# Patient Record
Sex: Female | Born: 1975 | Race: White | Hispanic: No | Marital: Married | Smoking: Never smoker
Health system: Southern US, Community
[De-identification: ages and names within clinical notes are randomized; demographics above are authoritative.]

## PROBLEM LIST (undated history)

## (undated) DIAGNOSIS — D649 Anemia, unspecified: Secondary | ICD-10-CM

## (undated) DIAGNOSIS — E119 Type 2 diabetes mellitus without complications: Secondary | ICD-10-CM

## (undated) DIAGNOSIS — I10 Essential (primary) hypertension: Secondary | ICD-10-CM

## (undated) DIAGNOSIS — J45909 Unspecified asthma, uncomplicated: Secondary | ICD-10-CM

## (undated) DIAGNOSIS — I82409 Acute embolism and thrombosis of unspecified deep veins of unspecified lower extremity: Secondary | ICD-10-CM

## (undated) DIAGNOSIS — R011 Cardiac murmur, unspecified: Secondary | ICD-10-CM

## (undated) DIAGNOSIS — K219 Gastro-esophageal reflux disease without esophagitis: Secondary | ICD-10-CM

## (undated) DIAGNOSIS — E039 Hypothyroidism, unspecified: Secondary | ICD-10-CM

## (undated) HISTORY — PX: TONSILLECTOMY AND ADENOIDECTOMY: SUR1326

## (undated) HISTORY — DX: Type 2 diabetes mellitus without complications: E11.9

## (undated) HISTORY — DX: Hypothyroidism, unspecified: E03.9

## (undated) HISTORY — DX: Essential (primary) hypertension: I10

## (undated) HISTORY — DX: Unspecified asthma, uncomplicated: J45.909

## (undated) HISTORY — PX: CARPAL TUNNEL RELEASE: SHX101

---

## 2002-02-05 ENCOUNTER — Other Ambulatory Visit: Admission: RE | Admit: 2002-02-05 | Discharge: 2002-02-05 | Payer: Self-pay | Admitting: Obstetrics and Gynecology

## 2002-09-15 ENCOUNTER — Encounter: Payer: Self-pay | Admitting: Obstetrics and Gynecology

## 2002-09-15 ENCOUNTER — Ambulatory Visit (HOSPITAL_COMMUNITY): Admission: RE | Admit: 2002-09-15 | Discharge: 2002-09-15 | Payer: Self-pay | Admitting: Obstetrics and Gynecology

## 2002-09-30 ENCOUNTER — Encounter: Admission: RE | Admit: 2002-09-30 | Discharge: 2002-12-29 | Payer: Self-pay | Admitting: Obstetrics and Gynecology

## 2002-11-13 ENCOUNTER — Encounter: Payer: Self-pay | Admitting: Obstetrics and Gynecology

## 2002-11-13 ENCOUNTER — Ambulatory Visit (HOSPITAL_COMMUNITY): Admission: RE | Admit: 2002-11-13 | Discharge: 2002-11-13 | Payer: Self-pay | Admitting: Obstetrics and Gynecology

## 2002-12-21 ENCOUNTER — Inpatient Hospital Stay (HOSPITAL_COMMUNITY): Admission: AD | Admit: 2002-12-21 | Discharge: 2002-12-21 | Payer: Self-pay | Admitting: Obstetrics and Gynecology

## 2002-12-23 ENCOUNTER — Inpatient Hospital Stay (HOSPITAL_COMMUNITY): Admission: AD | Admit: 2002-12-23 | Discharge: 2002-12-23 | Payer: Self-pay | Admitting: Obstetrics and Gynecology

## 2002-12-24 ENCOUNTER — Inpatient Hospital Stay (HOSPITAL_COMMUNITY): Admission: AD | Admit: 2002-12-24 | Discharge: 2002-12-24 | Payer: Self-pay | Admitting: Obstetrics and Gynecology

## 2003-01-01 ENCOUNTER — Inpatient Hospital Stay (HOSPITAL_COMMUNITY): Admission: AD | Admit: 2003-01-01 | Discharge: 2003-01-01 | Payer: Self-pay | Admitting: Obstetrics and Gynecology

## 2003-01-02 ENCOUNTER — Inpatient Hospital Stay (HOSPITAL_COMMUNITY): Admission: AD | Admit: 2003-01-02 | Discharge: 2003-01-02 | Payer: Self-pay | Admitting: Obstetrics and Gynecology

## 2003-01-03 ENCOUNTER — Encounter: Payer: Self-pay | Admitting: Obstetrics and Gynecology

## 2003-01-03 ENCOUNTER — Inpatient Hospital Stay (HOSPITAL_COMMUNITY): Admission: AD | Admit: 2003-01-03 | Discharge: 2003-01-03 | Payer: Self-pay | Admitting: Obstetrics and Gynecology

## 2003-01-05 ENCOUNTER — Inpatient Hospital Stay (HOSPITAL_COMMUNITY): Admission: AD | Admit: 2003-01-05 | Discharge: 2003-01-17 | Payer: Self-pay | Admitting: Obstetrics and Gynecology

## 2003-01-05 ENCOUNTER — Encounter: Payer: Self-pay | Admitting: Obstetrics and Gynecology

## 2003-01-07 ENCOUNTER — Encounter: Payer: Self-pay | Admitting: Obstetrics and Gynecology

## 2003-01-11 ENCOUNTER — Encounter: Payer: Self-pay | Admitting: Obstetrics and Gynecology

## 2003-01-13 ENCOUNTER — Encounter (INDEPENDENT_AMBULATORY_CARE_PROVIDER_SITE_OTHER): Payer: Self-pay | Admitting: *Deleted

## 2003-02-26 ENCOUNTER — Other Ambulatory Visit: Admission: RE | Admit: 2003-02-26 | Discharge: 2003-02-26 | Payer: Self-pay | Admitting: Obstetrics and Gynecology

## 2004-08-08 ENCOUNTER — Other Ambulatory Visit: Admission: RE | Admit: 2004-08-08 | Discharge: 2004-08-08 | Payer: Self-pay | Admitting: Obstetrics and Gynecology

## 2004-11-09 ENCOUNTER — Ambulatory Visit (HOSPITAL_COMMUNITY): Admission: RE | Admit: 2004-11-09 | Discharge: 2004-11-09 | Payer: Self-pay | Admitting: Obstetrics and Gynecology

## 2005-01-05 ENCOUNTER — Ambulatory Visit (HOSPITAL_COMMUNITY): Admission: RE | Admit: 2005-01-05 | Discharge: 2005-01-05 | Payer: Self-pay | Admitting: Obstetrics and Gynecology

## 2005-01-31 ENCOUNTER — Ambulatory Visit (HOSPITAL_COMMUNITY): Admission: RE | Admit: 2005-01-31 | Discharge: 2005-01-31 | Payer: Self-pay | Admitting: Obstetrics and Gynecology

## 2005-03-27 ENCOUNTER — Ambulatory Visit (HOSPITAL_COMMUNITY): Admission: RE | Admit: 2005-03-27 | Discharge: 2005-03-27 | Payer: Self-pay | Admitting: Obstetrics and Gynecology

## 2005-04-06 ENCOUNTER — Ambulatory Visit: Payer: Self-pay | Admitting: Obstetrics & Gynecology

## 2005-05-01 ENCOUNTER — Inpatient Hospital Stay (HOSPITAL_COMMUNITY): Admission: AD | Admit: 2005-05-01 | Discharge: 2005-05-01 | Payer: Self-pay | Admitting: Obstetrics and Gynecology

## 2005-05-04 ENCOUNTER — Inpatient Hospital Stay (HOSPITAL_COMMUNITY): Admission: AD | Admit: 2005-05-04 | Discharge: 2005-05-06 | Payer: Self-pay | Admitting: Obstetrics and Gynecology

## 2005-05-08 ENCOUNTER — Inpatient Hospital Stay (HOSPITAL_COMMUNITY): Admission: AD | Admit: 2005-05-08 | Discharge: 2005-05-08 | Payer: Self-pay | Admitting: Obstetrics and Gynecology

## 2005-05-13 ENCOUNTER — Inpatient Hospital Stay (HOSPITAL_COMMUNITY): Admission: AD | Admit: 2005-05-13 | Discharge: 2005-05-17 | Payer: Self-pay | Admitting: Obstetrics and Gynecology

## 2005-05-13 ENCOUNTER — Encounter (INDEPENDENT_AMBULATORY_CARE_PROVIDER_SITE_OTHER): Payer: Self-pay | Admitting: *Deleted

## 2005-05-16 ENCOUNTER — Ambulatory Visit: Payer: Self-pay | Admitting: Internal Medicine

## 2005-05-19 ENCOUNTER — Ambulatory Visit: Payer: Self-pay | Admitting: Family Medicine

## 2005-05-29 ENCOUNTER — Ambulatory Visit: Payer: Self-pay | Admitting: Family Medicine

## 2005-08-25 ENCOUNTER — Ambulatory Visit: Payer: Self-pay | Admitting: Family Medicine

## 2005-10-13 ENCOUNTER — Ambulatory Visit: Payer: Self-pay | Admitting: Family Medicine

## 2007-04-23 IMAGING — US US OB FOLLOW-UP
1 series · 13 of 28 positions shown · non-contrast
Comparison: none

CLINICAL DATA: Assess estimated fetal weight and growth.  Hypertension.  Assess fetal well-being.  Insulin dependent diabetes.

[Series 1: us ob follow-up · 13 of 36 slices shown]
[im 2/36]
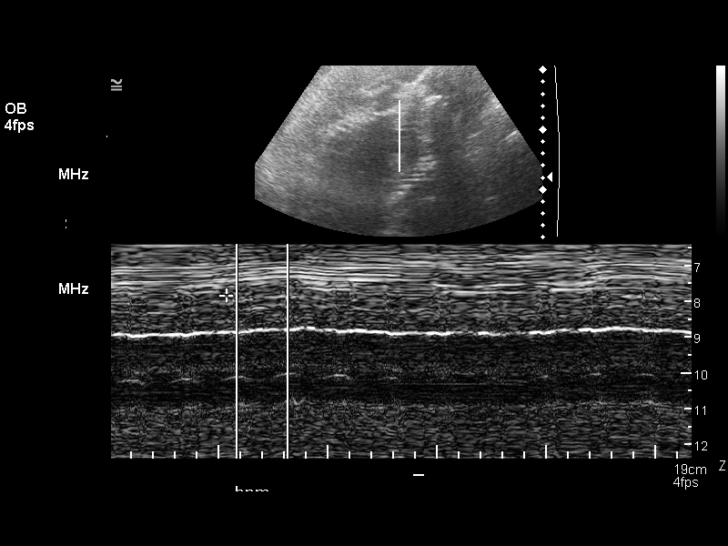
[im 4/36]
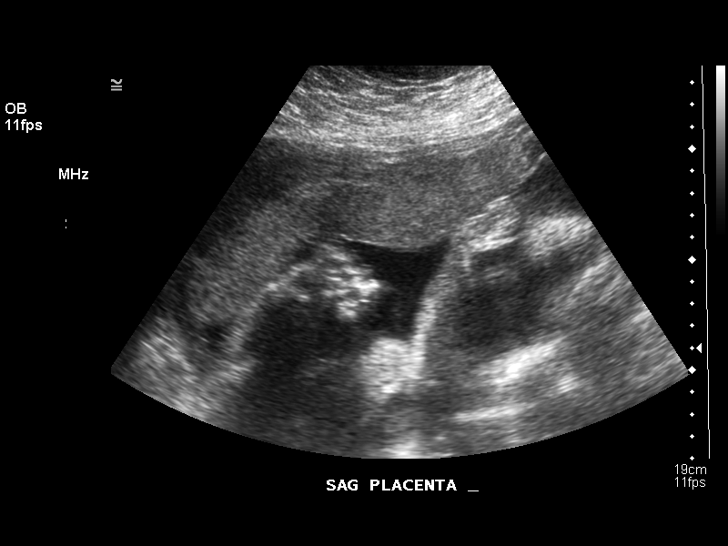
[im 7/36]
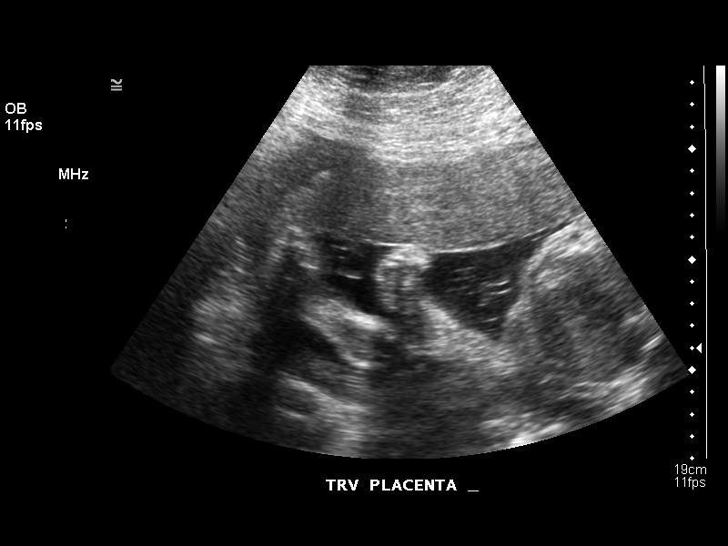
[im 10/36]
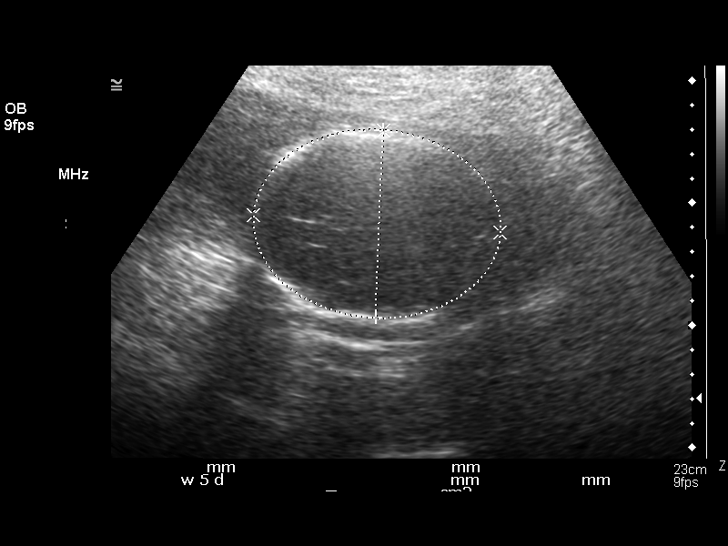
[im 12/36]
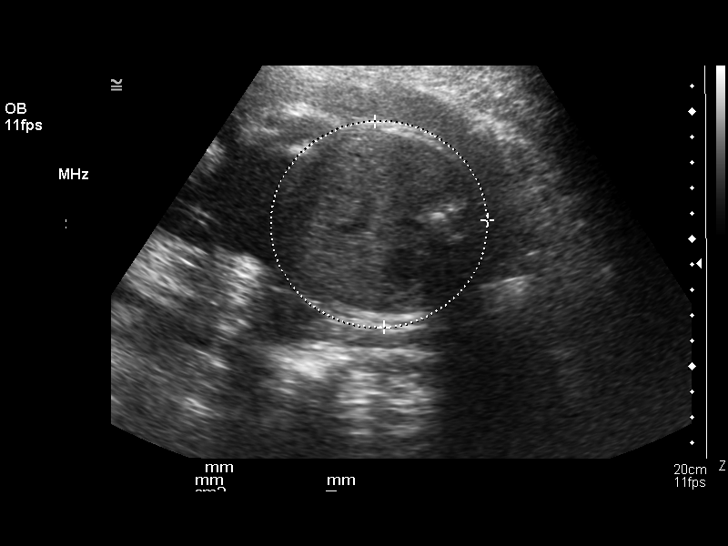
[im 15/36]
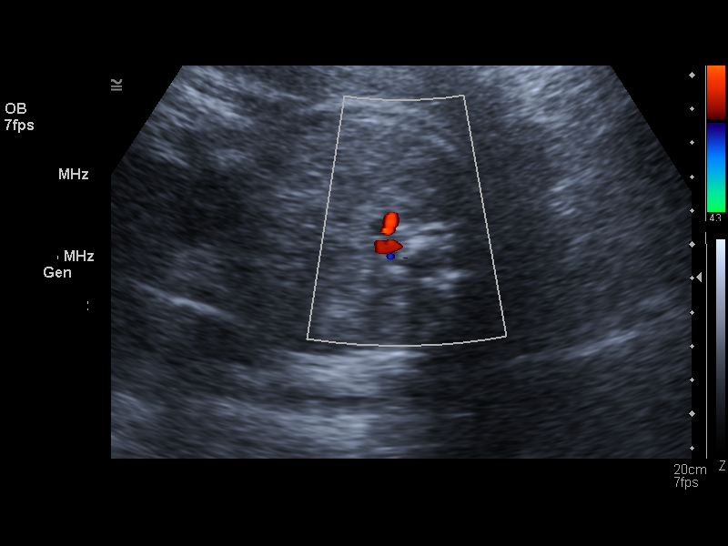
[im 19/36]
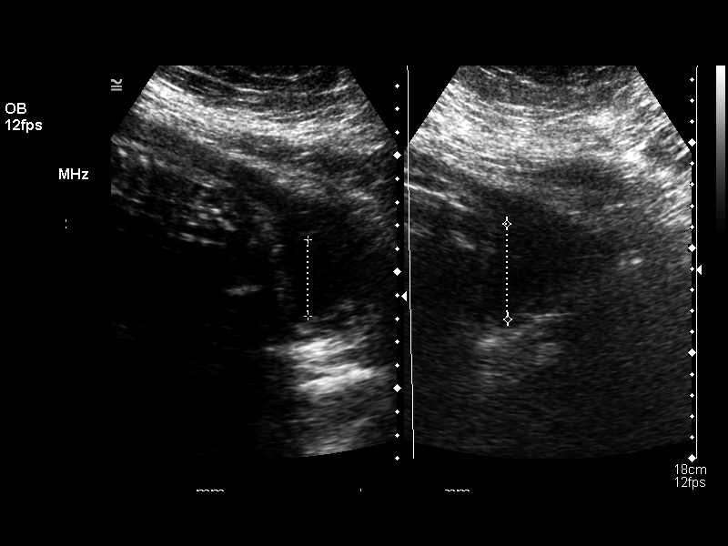
[im 21/36]
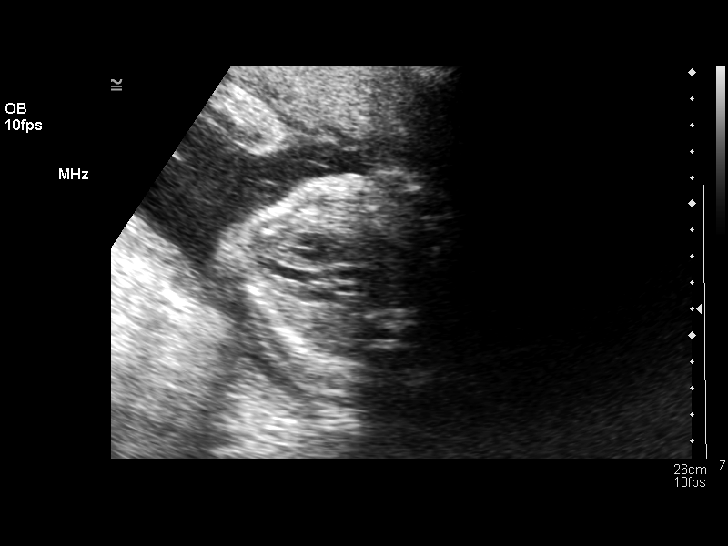
[im 24/36]
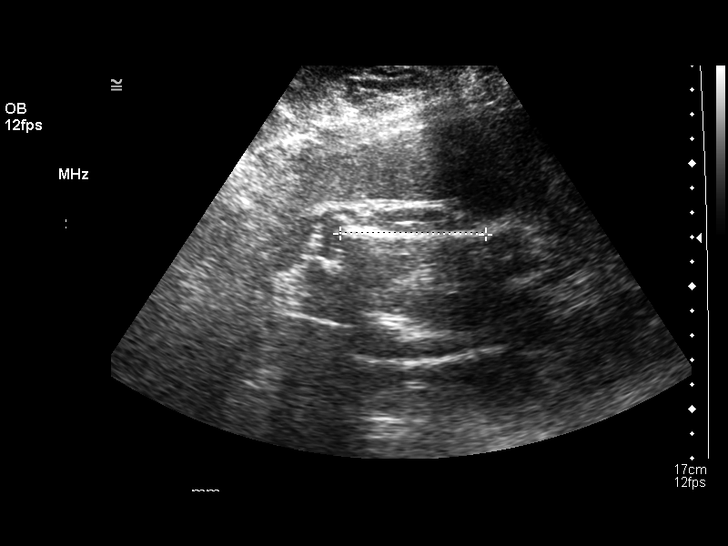
[im 26/36]
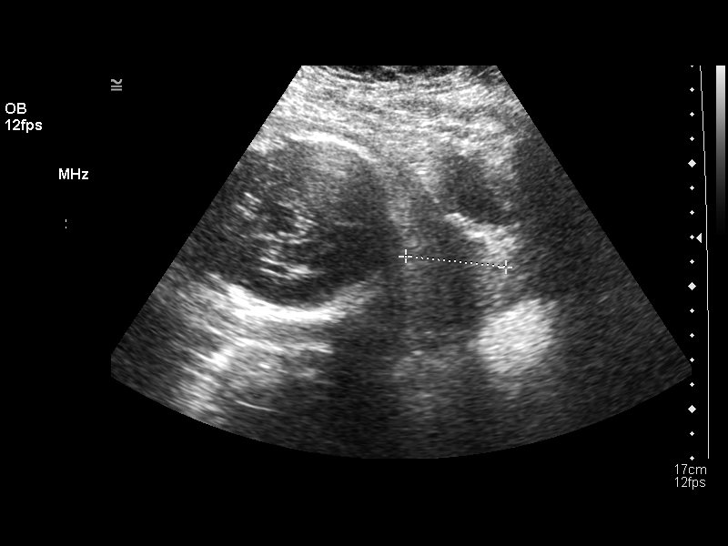
[im 29/36]
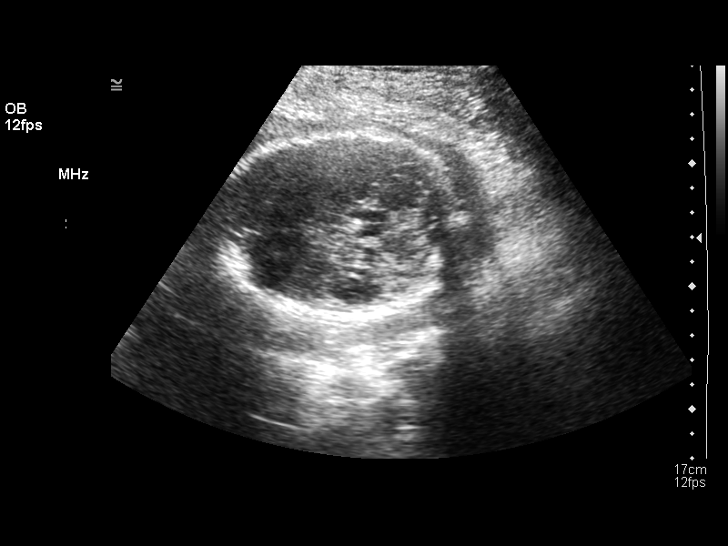
[im 32/36]
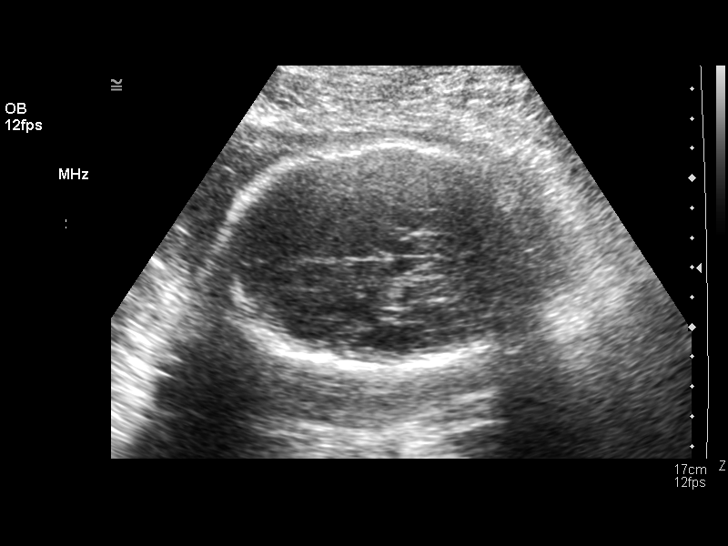
[im 34/36]
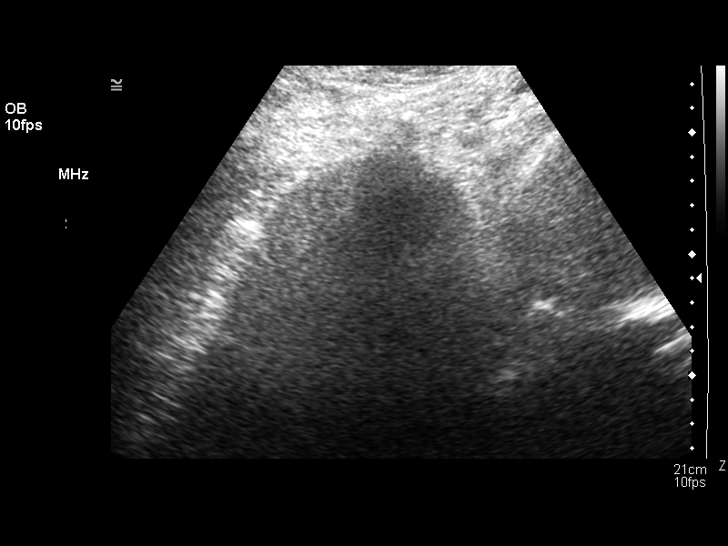

[13 of 28 positions shown; findings below may reference images not displayed]

OBSTETRICAL ULTRASOUND RE-EVALUATION:
Number of Fetuses:  1
Heart Rate  128:
Movement:  Yes
Breathing:  No
Presentation:  Cephalic
Placental Location:  Anterior
Grade:  I
Previa:  No
Amniotic Fluid (subjective):  Normal
Amniotic Fluid (objective):  16.6 cm AFI (5th -95th%ile = 9.0 ? 23.4 cm for 30 wks)

FETAL BIOMETRY
BPD:  7.5 cm   30 w 1 d
HC:  29.0 cm   31 w 3 d
AC:  26.0 cm   30 w 1 d
FL:  5.9 cm   30 w 5 d

Mean GA:  30 w 4 d
Assigned GA:  29 w 6 d
Fetal indices are within normal limits.
EFW:  3215 g (H) 75th ? 90th%ile (7377 ? 6117 g) For 30 wks

FETAL ANATOMY
Lateral Ventricles:  Visualized 
Thalami/CSP:  Visualized 
Posterior Fossa:  Previously seen 
Nuchal Region:  N/A
Spine:  Not visualized 
4 Chamber Heart on Left:  Previously seen 
Stomach on Left:  Visualized 
3 Vessel Cord:  Previously seen 
Cord Insertion Site:  Not visualized 
Kidneys:  Visualized 
Bladder:  Visualized 
Extremities:  Previously seen 

Evaluation limited by:  Maternal habitus

MATERNAL UTERINE AND ADNEXAL FINDINGS
Cervix:  4.1 cm Transabdominally

BIOPHYSICAL PROFILE

Movement:   2    Time:  30 minutes
Breathing:  0
Tone:  2
Amniotic Fluid:  2

Total Score:  6
IMPRESSION: 1.  Single intrauterine pregnancy demonstrating an estimated gestational age by ultrasound of 30 weeks and 4 days.  Correlation with assigned gestational age by LMP of 29 weeks and 6 days suggests appropriate growth.  Currently the estimated fetal weight is just above the 75th percentile for a 30 week gestation.  
2.  Subjectively and quantitatively normal amniotic fluid volume and normal cervical length.  
3.  No late developing fetal anatomic abnormalities are identified associated with the lateral ventricles, stomach, kidneys or bladder.  A four chamber heart view could not be reassessed due to positioning on today?s exam.  
4.  Biophysical profile score [DATE].  The fetus scored zero for fetal breathing.

## 2007-05-09 ENCOUNTER — Ambulatory Visit: Payer: Self-pay | Admitting: Otolaryngology

## 2007-05-13 ENCOUNTER — Ambulatory Visit: Payer: Self-pay | Admitting: Otolaryngology

## 2008-09-21 ENCOUNTER — Inpatient Hospital Stay (HOSPITAL_COMMUNITY): Admission: AD | Admit: 2008-09-21 | Discharge: 2008-09-21 | Payer: Self-pay | Admitting: Obstetrics and Gynecology

## 2008-09-24 ENCOUNTER — Inpatient Hospital Stay (HOSPITAL_COMMUNITY): Admission: AD | Admit: 2008-09-24 | Discharge: 2008-09-24 | Payer: Self-pay | Admitting: Obstetrics and Gynecology

## 2008-09-25 ENCOUNTER — Inpatient Hospital Stay (HOSPITAL_COMMUNITY): Admission: AD | Admit: 2008-09-25 | Discharge: 2008-09-25 | Payer: Self-pay | Admitting: Obstetrics and Gynecology

## 2008-09-28 ENCOUNTER — Inpatient Hospital Stay (HOSPITAL_COMMUNITY): Admission: AD | Admit: 2008-09-28 | Discharge: 2008-09-28 | Payer: Self-pay | Admitting: Obstetrics and Gynecology

## 2008-10-06 ENCOUNTER — Encounter (INDEPENDENT_AMBULATORY_CARE_PROVIDER_SITE_OTHER): Payer: Self-pay | Admitting: Obstetrics and Gynecology

## 2008-10-06 ENCOUNTER — Inpatient Hospital Stay (HOSPITAL_COMMUNITY): Admission: AD | Admit: 2008-10-06 | Discharge: 2008-10-10 | Payer: Self-pay | Admitting: Obstetrics and Gynecology

## 2008-10-06 ENCOUNTER — Other Ambulatory Visit: Payer: Self-pay | Admitting: Obstetrics and Gynecology

## 2010-04-08 ENCOUNTER — Ambulatory Visit (HOSPITAL_COMMUNITY): Admission: RE | Admit: 2010-04-08 | Discharge: 2010-04-08 | Payer: Self-pay | Admitting: Obstetrics and Gynecology

## 2010-07-13 ENCOUNTER — Inpatient Hospital Stay (HOSPITAL_COMMUNITY): Admission: AD | Admit: 2010-07-13 | Discharge: 2010-07-19 | Payer: Self-pay | Admitting: Obstetrics and Gynecology

## 2010-07-15 ENCOUNTER — Encounter (INDEPENDENT_AMBULATORY_CARE_PROVIDER_SITE_OTHER): Payer: Self-pay | Admitting: Obstetrics and Gynecology

## 2010-10-09 HISTORY — PX: LAPAROSCOPIC GASTRIC SLEEVE RESECTION: SHX5895

## 2010-10-09 HISTORY — PX: CHOLECYSTECTOMY: SHX55

## 2010-12-21 LAB — CBC
HCT: 30.6 % — ABNORMAL LOW (ref 36.0–46.0)
HCT: 35.9 % — ABNORMAL LOW (ref 36.0–46.0)
Hemoglobin: 10.3 g/dL — ABNORMAL LOW (ref 12.0–15.0)
Hemoglobin: 12.4 g/dL (ref 12.0–15.0)
Hemoglobin: 12.4 g/dL (ref 12.0–15.0)
MCH: 30.1 pg (ref 26.0–34.0)
MCH: 30.2 pg (ref 26.0–34.0)
MCH: 30.5 pg (ref 26.0–34.0)
MCHC: 33.5 g/dL (ref 30.0–36.0)
MCHC: 33.5 g/dL (ref 30.0–36.0)
MCHC: 33.9 g/dL (ref 30.0–36.0)
MCV: 89.5 fL (ref 78.0–100.0)
MCV: 91.1 fL (ref 78.0–100.0)
Platelets: 230 10*3/uL (ref 150–400)
Platelets: 231 10*3/uL (ref 150–400)
RBC: 4.06 MIL/uL (ref 3.87–5.11)
RBC: 4.14 MIL/uL (ref 3.87–5.11)
RBC: 4.16 MIL/uL (ref 3.87–5.11)
RDW: 14.7 % (ref 11.5–15.5)
RDW: 14.7 % (ref 11.5–15.5)
WBC: 11.1 10*3/uL — ABNORMAL HIGH (ref 4.0–10.5)
WBC: 12.9 10*3/uL — ABNORMAL HIGH (ref 4.0–10.5)
WBC: 7 10*3/uL (ref 4.0–10.5)

## 2010-12-21 LAB — GLUCOSE, CAPILLARY
Glucose-Capillary: 104 mg/dL — ABNORMAL HIGH (ref 70–99)
Glucose-Capillary: 127 mg/dL — ABNORMAL HIGH (ref 70–99)
Glucose-Capillary: 131 mg/dL — ABNORMAL HIGH (ref 70–99)
Glucose-Capillary: 135 mg/dL — ABNORMAL HIGH (ref 70–99)
Glucose-Capillary: 161 mg/dL — ABNORMAL HIGH (ref 70–99)
Glucose-Capillary: 188 mg/dL — ABNORMAL HIGH (ref 70–99)
Glucose-Capillary: 219 mg/dL — ABNORMAL HIGH (ref 70–99)
Glucose-Capillary: 70 mg/dL (ref 70–99)
Glucose-Capillary: 87 mg/dL (ref 70–99)
Glucose-Capillary: 92 mg/dL (ref 70–99)
Glucose-Capillary: 92 mg/dL (ref 70–99)
Glucose-Capillary: 94 mg/dL (ref 70–99)

## 2010-12-21 LAB — COMPREHENSIVE METABOLIC PANEL
ALT: 16 U/L (ref 0–35)
ALT: 17 U/L (ref 0–35)
ALT: 18 U/L (ref 0–35)
AST: 16 U/L (ref 0–37)
AST: 19 U/L (ref 0–37)
AST: 21 U/L (ref 0–37)
AST: 22 U/L (ref 0–37)
Albumin: 2.5 g/dL — ABNORMAL LOW (ref 3.5–5.2)
Albumin: 2.8 g/dL — ABNORMAL LOW (ref 3.5–5.2)
Alkaline Phosphatase: 119 U/L — ABNORMAL HIGH (ref 39–117)
Alkaline Phosphatase: 124 U/L — ABNORMAL HIGH (ref 39–117)
CO2: 21 mEq/L (ref 19–32)
CO2: 24 mEq/L (ref 19–32)
Calcium: 6.5 mg/dL — ABNORMAL LOW (ref 8.4–10.5)
Calcium: 8.1 mg/dL — ABNORMAL LOW (ref 8.4–10.5)
Calcium: 9.2 mg/dL (ref 8.4–10.5)
Chloride: 104 mEq/L (ref 96–112)
Chloride: 106 mEq/L (ref 96–112)
Chloride: 106 mEq/L (ref 96–112)
Creatinine, Ser: 0.71 mg/dL (ref 0.4–1.2)
Creatinine, Ser: 0.73 mg/dL (ref 0.4–1.2)
Creatinine, Ser: 0.8 mg/dL (ref 0.4–1.2)
GFR calc Af Amer: >60 mL/min (ref >60)
GFR calc Af Amer: >60 mL/min (ref >60)
GFR calc Af Amer: >60 mL/min (ref >60)
GFR calc Af Amer: >60 mL/min (ref >60)
GFR calc non Af Amer: >60 mL/min (ref >60)
GFR calc non Af Amer: >60 mL/min (ref >60)
GFR calc non Af Amer: >60 mL/min (ref >60)
Glucose, Bld: 87 mg/dL (ref 70–99)
Potassium: 4 mEq/L (ref 3.5–5.1)
Potassium: 4.2 mEq/L (ref 3.5–5.1)
Sodium: 134 mEq/L — ABNORMAL LOW (ref 135–145)
Sodium: 135 mEq/L (ref 135–145)
Sodium: 139 mEq/L (ref 135–145)
Total Bilirubin: 0.2 mg/dL — ABNORMAL LOW (ref 0.3–1.2)
Total Bilirubin: 0.3 mg/dL (ref 0.3–1.2)
Total Protein: 5.5 g/dL — ABNORMAL LOW (ref 6.0–8.3)

## 2010-12-21 LAB — MAGNESIUM
Magnesium: 4.9 mg/dL — ABNORMAL HIGH (ref 1.5–2.5)
Magnesium: 5.6 mg/dL — ABNORMAL HIGH (ref 1.5–2.5)

## 2010-12-21 LAB — URIC ACID: Uric Acid, Serum: 7.4 mg/dL — ABNORMAL HIGH (ref 2.4–7.0)

## 2010-12-21 LAB — MRSA PCR SCREENING
MRSA by PCR: NEGATIVE
MRSA by PCR: NEGATIVE

## 2011-01-23 LAB — GLUCOSE, CAPILLARY
Glucose-Capillary: 127 mg/dL — ABNORMAL HIGH (ref 70–99)
Glucose-Capillary: 71 mg/dL (ref 70–99)
Glucose-Capillary: 97 mg/dL (ref 70–99)

## 2011-02-21 NOTE — Op Note (Signed)
[name], [name]               ACCOUNT NO.:  [id number]   MEDICAL RECORD NO.:  [id number]          PATIENT TYPE:  INP   LOCATION:  9131                          FACILITY:  [facility]   PHYSICIAN:  Janine Limbo, M.D.DATE OF BIRTH:  1975-12-28   DATE OF PROCEDURE:  10/06/2008  DATE OF DISCHARGE:                               OPERATIVE REPORT   PREOPERATIVE DIAGNOSES:  1. A 38 weeks and 5-day gestation.  2. Hypertension.  3. Diabetes, requiring insulin.  4. Hypothyroidism.  5. Obesity (weight 282 pounds, height 5 feet 2 inches).  6. Two prior cesarean sections.  7. Nonreassuring biophysical profile.   POSTOPERATIVE DIAGNOSES:  1. A 38 weeks and 5-day gestation.  2. Hypertension.  3. Diabetes, requiring insulin.  4. Hypothyroidism.  5. Obesity (weight 282 pounds, height 5 feet 2 inches).  6. Two prior cesarean sections.  7. Nonreassuring biophysical profile.  8. Pelvic adhesions.   PROCEDURE:  Repeat low-transverse cesarean section.   SURGEON:  Janine Limbo, MD   FIRST ASSISTANT:  Eulogio Bear, Certified Nurse Midwife   ANESTHETIC:  Spinal.   DISPOSITION:  [name] is a 35 year old female, gravida 3, para 0-2-0-  0, who presents at 38 weeks and 5 days.  The patient has been followed  at the [facility] division of [name].  The patient has the above-mentioned diagnosis.  She is scheduled for repeat cesarean section in 2 days.  She was seen in  the office today and was noted to have a biophysical profile of 6/10.  No fetal breathing motions were appreciated.  The nonstress test was  nonreactive in the office.  The patient was sent to [facility] at  [facility] for prolonged evaluation.  The nonstress test did become  reactive; however, repeat biophysical profile showed that again there  were no fetal motions present.  We discussed our management options,  which included observation at home, repeat  biophysical profile in 1 day,  and cesarean delivery.  The risk and benefits of each of those options  were outlined.  Questions were answered.  After careful consideration,  the patient elected to proceed with cesarean section at this point.  The  patient does not desire a tubal ligation.   FINDINGS:  A 7-pound 13-ounce female infant [name]) was delivered with  the assistance of a Kiwi vacuum extractor.  The Apgar scores were 9 at  one minute and 9 at five minutes.  There were dense adhesions present  throughout all of the layers.  The omentum was adhered to the left lower  uterine segment.  The bladder was adhered to the lower uterine segment  and the mid the anterior uterus.  The fallopian tubes and the ovaries  appeared normal, however.  The arterial cord blood pH was 7.14.  Meconium-stained amniotic fluid was present, and the patient was  suctioned using a DeLee trap.   DESCRIPTION OF PROCEDURE:  The patient was taken to the operating room,  where a spinal anesthetic was given.  The patient's abdomen was prepped  with multiple layers of antiseptic  solution (allergic to BETADINE) as  was the perineum and vagina.  A Foley catheter was placed in the  bladder.  The patient was sterilely draped.  The lower abdomen was  injected with 10 mL of 0.5% Marcaine with epinephrine.  A low-transverse  incision was made in the abdomen and carried sharply through the  subcutaneous tissue, fascia, and the anterior peritoneum.  There were  adhesions present in all layers and the procedure was complicated by the  patient's massively obese abdomen.  We entered the peritoneal cavity  very high in the abdomen because of our concern for adhesions.  The  bladder was noted to be advanced to the mid uterus.  We carefully  inspected the bladder and there was no evidence that we had damaged the  bladder.  The adhesions between the omentum and the left uterus and the  adhesions between the bladder and the mid  uterus were then lysed using  blunt dissection.  Again, care was taken, so that we did not damage the  bladder.  Once we were able to reach the lower uterine segment, an  incision was made and the incision was continued in a low-transverse  fashion.  Again, the surgery was complicated by the patient's massively  obese abdomen.  We ruptured the membranes and moderate meconium-stained  fluid was noted.  We then attempted to deliver the fetal head, but we  had difficulty because of the patient's obese abdomen.  A Kiwi vacuum  extractor was used, so that we could deliver the fetal head.  The mouth  and nose were suctioned using the DeLee trap.  The remainder of the  infant was then delivered.  The cord was clamped and cut and the infant  was handed to the waiting pediatric team.  Routine cord blood studies  were obtained.  The arterial cord blood pH was 7.14.  The placenta was  removed.  The uterine cavity was cleaned of amniotic fluid, clotted  blood, and membranes.  The uterine incision was closed using a running  locking suture of 2-0 Vicryl followed by an imbricating suture of 2-0  Vicryl.  Hemostasis was noted to be adequate.  The pelvis was vigorously  irrigated.  The anterior peritoneum and the abdominal musculature were  reapproximated in the midline using 2-0 Vicryl.  The fascia was  irrigated.  The fascia was closed using a running suture of 0 Vicryl  followed by 3 interrupted sutures of 0 Vicryl.  The subcutaneous layer  was irrigated.  Hemostasis was achieved using the Bovie cautery.  A  Jackson-Pratt drain was placed in the subcutaneous layer and brought out  through the left lower quadrant.  It was sutured into place using 3-0  silk.  The subcutaneous layer was closed using a running suture of 0  Vicryl.  The skin was reapproximated using a subcuticular suture of 3-0  Monocryl.  Sponge, needle, and instrument counts were correct on two  occasions.  Estimated blood loss for the  procedure was 700 mL.  The  patient tolerated her procedure well.  She was taken to the recovery  room in stable condition.  The infant was taken to the full-term nursery  in stable condition.  The placenta was sent to labor and delivery.      Janine Limbo, M.D.  Electronically Signed     AVS/MEDQ  D:  10/06/2008  T:  10/07/2008  Job:  [id number]

## 2011-02-21 NOTE — Discharge Summary (Signed)
[name], Wolfe               ACCOUNT NO.:  [id number]   MEDICAL RECORD NO.:  [id number]          PATIENT TYPE:  INP   LOCATION:  9131                          [facility]ILITY:  [facility]   PHYSICIAN:  Osborn Coho, M.D.   DATE OF BIRTH:  01-Sep-1976   DATE OF ADMISSION:  10/06/2008  DATE OF DISCHARGE:  10/10/2008                               DISCHARGE SUMMARY   ATTENDING PHYSICIAN:  Osborn Coho, MD   ADMITTING DIAGNOSES:  1. Intrauterine pregnancy at 38-5/7 weeks.  2. Chronic hypertension with a history of severe preeclampsia.  3. Adult-onset diabetes, insulin-dependent.  4. Hypothyroidism.  5. Previous cesarean section x2.  6. Morbid obesity.  7. Asthma.  8. Unsure dates.  9. Nonreassuring biophysical profile.   DISCHARGE DIAGNOSES:  1. 38-5/7 weeks.  2. Chronic hypertension.  3. Diabetes requiring insulin.  4. Hypothyroidism.  5. Obesity.  6. Two previous cesarean sections.  7. Nonreassuring biophysical profile  8. Pelvic adhesions.   PROCEDURES:  1. Repeat low-transverse cesarean section.  2. Spinal anesthesia.   HOSPITAL COURSE:  Ms.  Wolfe is a 35 year old gravida 3, para 0-2-0-0-2  at 38-5/7 weeks who was seen in the office on October 06, 2008 for  biophysical profile of 6/10.  There were no fetal breathing movements  and nonstress test was nonreactive in the office.  She was scheduled for  repeat cesarean section on October 08, 2008.  The patient was sent to  [facility] for some further evaluation.  Nonstress test did become reactive; however, repeat BPP showed again the  absence of no fetal breathing movements.  Management options were  reviewed with the patient by Dr. Stefano Gaul which included observation at  home, repeat BPP in 1 day, and cesarean delivery.  The patient elected  to proceed with cesarean section.  She did not desire tubal.  She was  taken to the operating room where a 7-pound 13-ounce female infant,  [name] was  delivered with the assistance of the kiwi vacuum by Dr.  Stefano Gaul.  Apgars were 9 and 9.  There were some significant adhesions  present and these were taken down appropriately.  Tubes and ovaries were  normal.  Arterial cord blood was 7.14.  There was meconium-stained  amniotic fluid present.  The infant was taken to the full-term nursery  and mother was taken to recovery in good condition.  She did have a  Jackson-Pratt drain placed in the subcutaneous layer.  By postop day #1,  the patient was doing well.  She was draining clear urine from the  Foley.  She denied any PIH symptoms.  Blood pressure was 124/80.  Fasting blood sugar was 64, 2-hour PCs were 63-77.  She has had a  hypoglycemic episode with the CBG of 55 at 5:30 a.m.  She then ate and  did go up to 82.  Her insulin had already been discontinued.  She at  that time was placed on metformin 500 mg p.o. daily and she was to  continue fasting blood sugars and 2-hour PCs.  Her hemoglobin on day #1  postop was  10.9, previously has been 12.2, white blood cell count was  9.3, platelet count was 211.  Basic metabolic panel was also within  normal limits.  Her incision was clean, dry, and intact with a dressing  in place.  There were positive bowel sounds noted.  She had 1+ edema  noted.  She was continued on her previous Synthroid dose.  She was  placed on Aldomet 500 t.i.d.  By postop day #2, the patient was still  having some mild elevations of her blood pressure.  Her physical exam  was within normal limits.  Her CBG fasting was 75.  Postop day #3, the  patient was desiring to go home.  However, her blood pressures were  still elevated.  She was at that time on Aldomet 500 mg q.i.d. with  hydrochlorothiazide added.  However, later in that afternoon, she still  had elevated blood pressure of 171/111 and 179/108.  This was after pain  medication and rest.  Dr. Su Hilt was consulted.  The patient advised  that this happened with her  previous delivery and she at that time was  placed back on the previous medications she had used prior to pregnancy  which was Avalide.  Aldomet initially was increased to 750 q.i.d. and  hydrochlorothiazide; however, these were discontinued later that day and  the patient was given in the evening of October 09, 2008 a dose of  Avalide 150/12.5 mg.  The plan was made to observe her overnight.  If  her blood pressure stabilizes, she would be able to go home the next  day.   By the morning of October 10, 2008, the patient was doing well.  Her  blood pressures were 172/95, 151/86, and 158/94.  Her vital signs were  stable, otherwise fasting blood sugar was 71, 2-hour PCs were 96-131.  Her physical exam was within normal limits.  Dr. Su Hilt was consulted.  The decision was made to give her a full dose of Adalat 300/25 with a  plan to recheck her blood pressure at 11:00 a.m..  In light of it being  stable, then she was able to be discharged home.  She was to be  discharged home on that dose of Avalide with close followup postpartum.  The patient was agreeable with this plan and desiring to go home,  therefore she was discharged home on October 10, 2008.  Discharge  instructions per [facility] handout.  PIH precautions and  hypertensive precautions were also reviewed with the patient.  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   DISCHARGE MEDICATIONS:  1. Metformin 500 mg one p.o. daily.  2. Avalide 300/25 one p.o. daily.  3. Motrin 600 mg p.o. q.6 h. p.r.n. pain.  4. Percocet 5/325 one to two p.o. q.3-4 h. p.r.n. pain.   DISCHARGE FOLLOWUP:  [facility] nurse will see the patient on October 12, 2008 for blood pressure check with the office notified of that  result.  She also will follow up at [facility] for blood  pressure check on October 15, 2008.  The office will call the patient to  schedule.  Routine followup will occur then again at 6 weeks and p.r.n.  The patient is breastfeeding.      Renaldo Reel Emilee Hero, C.N.M.      Osborn Coho, M.D.  Electronically Signed   VLL/MEDQ  D:  10/10/2008  T:  10/10/2008  Job:  [id number]

## 2011-02-21 NOTE — H&P (Signed)
NAMEMAKAYLI, Wolfe               ACCOUNT NO.:  [id number]   MEDICAL RECORD NO.:  [id number]          PATIENT TYPE:  INP   LOCATION:  9199                          [facility]   PHYSICIAN:  Janine Limbo, M.D.DATE OF BIRTH:  18-Nov-1975   DATE OF ADMISSION:  10/06/2008  DATE OF DISCHARGE:                              HISTORY & PHYSICAL   [name] is a 35 year old gravida 3 para 0-2-0-2 who is 38 weeks and 5  days who has a repeat cesarean on the books for 2 days from now, and was  sent over from the clinic today for a BPP of 6 with no breathing  movement.  She is followed by the MD's at [facility].  Her  pregnancy was remarkable for the following risk factors.  1. Adult onset diabetes mellitus, insulin dependent.  2. Chronic hypertension with a history of severe preeclampsia.  3. Hypothyroidism with thyroid nodules.  4. Previous cesarean x2.  5. Morbid obesity, current weight 298 pounds.  6. Asthma.  7. Unsure dates. old gravida 3 para 0-2-0-2 who is 38 weeks and 5  days who has a repeat cesarean on the books for 2 days from now, and was  sent over from the clinic today for a BPP of 6 with no breathing  movement.  She is followed by the MD's at [facility].  Her  pregnancy was remarkable for the following risk factors.  1. Adult onset diabetes mellitus, insulin dependent.  2. Chronic hypertension with a history of severe preeclampsia.  3. Hypothyroidism with thyroid nodules.  4. Previous cesarean x2.  5. Morbid obesity, current weight 298 pounds.  6. Asthma.  7. Unsure dates.   PRENATAL LABS:  [name] new OB labs when she entered care at 6  weeks back in May included a hemoglobin 13.1, hematocrit 39.1, platelets  416,000.  Blood type A+, antibody screen negative, RPR nonreactive,  rubella titer immune, hepatitis B surface antigen negative, HIV  nonreactive.  Her TSH was 2.267.  Her Dextrostix at her new OB was 266.  She has had multiple preeclampsia panels done during the course of her  pregnancy and a couple rounds of 24-hour urine for protein that were all  normal.   [name] has declined genetic testing.  There is no record of group B  beta strep in her prenatal chart.   CURRENT MEDICATIONS:  1. Insulin NPH 58 units every night.  2. Regular insulin 18 units before dinner.  3. Aldomet 500 mg 3 times a day.  4. ProAir.  5. Pulmicort.  6. Prenatal vitamins.  7. Synthroid 50 mcg.   HISTORY OF PRESENT PREGNANCY:  She entered prenatal care at 6 weeks in  early May with poorly managed glucose and a diagnosis  of hypothyroidism.  She had an ultrasound at 6 weeks.  No fetal pole yet, but intrauterine  pregnancy was confirmed.  Her Avalide was discontinued and she was  changed to Aldomet.  Her insulin was adjusted.  Insulin was adjusted  again at 36 weeks.  Her TSH was found to be normal.  Her Synthroid dose  was left at the current dose.  She was seen at 10 weeks and 15 weeks.  Her blood pressure medication was adjusted and her insulin was adjusted  again.  She anatomy scan and 18 weeks 3 days that showed size consistent  with dates.  Cervix 4.99 cm.  Normal anatomy, normal fluid.  Cardiac  echo was ordered on the baby.  The patient got an H1N1 flu vaccination  the third week of October, then was begun on a BPP and NST at 32 weeks.  She has been battling sinus infections and nasal congestion the past 2  months.  Has  been in and out of the MAU for elevated readings on her  blood pressure machine at home with negative pre-eclamptic workups in  the MAU.  On our stay, she was given her routine BPP and there were no  breathing movements found and diminished variability on the fetal heart  rate on NST.  She was sent over to the MAU and subsequently a very  reactive NST was obtained but follow up BPP showed still no breathing  movements.  Dr. Stefano Gaul consulted with the patient and offered her  either wait for her scheduled cesarean in 2 days or proceed with  cesarean today.  She chose to proceed with the cesarean today.  The  risks and benefits were reviewed.  The patient understands this is her  third cesarean and she is also a Designer, jewellery.   OB HISTORY:  Gravida 1 a daughter born in April 2000 primary low  transverse cesarean.  Severe preeclampsia.  That was at 36 weeks and 2  days.  Gravida 2, a son born in August 2006.  Repeat cesarean.  She had  superimposed preeclampsia help in that the child was delivered at 36  weeks of 4 days.   MEDICATION ALLERGIES:  Has no known drug allergies and no  latex  allergies.   MEDICAL HISTORY:  The patient began menstruation at age 35.  She has  cycles that come every 28 days, and last 3 to 4 days.  She had a history  of gestational diabetes and adult onset diabetes which is insulin  dependent during pregnancy.  She has history of PIH was superimposed  preeclampsia and HELLP syndrome last pregnancy, now chronic  hypertension.  She has preterm deliveries times two.  She has chronic  hypertension as mentioned.  She has asthma and is on Pulmicort and  ProAir.  She has diabetes and hypothyroid.  She has a stomach ulcer.  She also has problem with a carpal tunnel syndrome.   SURGICAL HISTORY:  She has had her tonsils removed.  She had carpal  tunnel surgery and she has had 2 prior cesareans.   FAMILY HISTORY:  The patient's mother and maternal grandmother have high  blood pressure and varicose veins.  The patient's mother, maternal  grandmother have diabetes and the patient's sister has anemia and  epilepsy.  Her paternal grandmother has breast cancer.  The patient and  family smoke.   GENETIC HISTORY:  There is no record of cystic fibrosis screen.  There  is no genetic screening done during pregnancy.  The patient's mother and  father with heart murmurs.   SOCIAL HISTORY:  She is a married white female.  Her husband's name is  Journi Moffa.  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  Her husband has a high school diploma and is a  Curator.  She denies use of alcohol, tobacco products, or street drugs  during this pregnancy.  She declines a religious preference.   PHYSICAL EXAM:  VITAL SIGNS:  Stable and within normal limits including  her blood pressure.  LUNGS:  Clear bilaterally to auscultation.  HEART:  Regular rate and rhythm.  No murmurs.  Trace edema of upper  extremities.  ABDOMEN:  Gravid uterus, size greater than dates.  Soft to palpation.  EXTREMITIES:  +2 edema lower extremities.  DTRs +2/+2  . Negative Homans  x2.  Negative clonus times 2.  Follow up BPP 8/10 in the MAU today.  IMPRESSION:  A 35 year old gravid 3 para 0-2-0-2 at 38 weeks 5 days with  insulin dependent, adult onset diabetes, coronary hypertension, and a  BPP of 8/10.  Elective repeat cesarean.  Dr. Stefano Gaul consulted.  The  patient admitted.  Preop labs.  Plan to proceed to operating room.      Eulogio Bear, CNM      ______________________________  Janine Limbo, M.D.    JM/MEDQ  D:  10/06/2008  T:  10/06/2008  Job:  [id number]

## 2011-02-24 NOTE — H&P (Signed)
[name], [name]                           ACCOUNT NO.:  [id number]   MEDICAL RECORD NO.:  [id number]                   PATIENT TYPE:  INP   LOCATION:  9174                                 FACILITY:  [facility]   PHYSICIAN:  Crist Fat. Rivard, M.D.              DATE OF BIRTH:  June 18, 1976   DATE OF ADMISSION:  01/05/2003  DATE OF DISCHARGE:                                HISTORY & PHYSICAL   REASON FOR ADMISSION:  Intrauterine pregnancy at 35 weeks and 2 days with  chronic hypertension with superimposed preeclampsia and insulin-dependent  gestational diabetes.   HISTORY OF PRESENT ILLNESS:  This is a 35 year old, married, white female,  gravida 1, para 0, with a due date by ultrasound of Feb 08, 2003, currently  at 35 weeks and 2 days, who is being admitted for antenatal monitoring of  superimposed preeclampsia over chronic hypertension.  She has been known for  chronic hypertension for a few years and with antenatal modification of her  antihypertensive to Aldomet 250 mg q.i.d.  She is currently using labetalol  300 mg t.i.d. with overall good control over her hypertension.  She reports  a blood pressure at home in the 130s-140s/low 90s.  She comes in today  complaining of an unusual headache which is bilateral, retro-orbital, and  has not responded to her usual 1000 mg of Tylenol.  She denies any visual  symptoms.  Denies any right upper quadrant pain.  Reports good fetal  activity.  She also denies any contractions, leakage of fluid, or bleeding.  She has been followed closely in the last two weeks for worsening  hypertension and modification of her antihypertensive medication.  Her last  PIH labs were drawn on Friday, January 02, 2003, and were all within normal  limits.  Her uric acid, which had previously been elevated up to 9.4 was  back to 5.8 after she has discontinued hydrochlorothiazide in her medication  regimen.  She has completed her first 24-hour collection from January 01, 2003, to January 02, 2003, which was significant for a proteinuria of 880  mg/24 hours.   She is admitted today for evaluation of her superimposed preeclampsia and  decision regarding the need for early induction.   ALLERGIES:  Known allergy to DARVOCET and SHELLFISH.   PAST MEDICAL HISTORY:  1. Chronic hypertension.  2. Obesity.  3. Mild asthma.  4. Heart murmur which requires antibiotic prophylaxis for dental work.  5. Status post tonsillectomy.  6. Status post carpal tunnel correction bilaterally.   SOCIAL HISTORY:  Married.  Nonsmoker.  She is a Production designer, theatre/television/film.  She  has been out of work for the last two weeks on bed rest.   FAMILY HISTORY:  Mother with diabetes.  Maternal grandmother with diabetes.   PRENATAL LABORATORY DATA:  Blood type A+.  Toxoplasmosis negative.  RPR  nonreactive.  Rubella immune.  HBSAG negative.  HIV  declined.  Cystic  fibrosis declined.  Declined 16-week triple screen.  The 18-week glucose  challenge test was elevated at 211 and her three-hour glucose challenge test  was abnormal at 19 weeks of pregnancy.  She is currently using Humulin NPH  16 units a bedtime and has had very good control of her diabetes with  fasting blood sugars in the 70s-80s and two-hour glucose below 120.  Her 20-  week ultrasound was performed at [facility] and revealed normal  anatomy survey female baby.  Her last ultrasound on Friday revealed an  average for gestational age in the 25th percentile, normal amniotic fluid  index, and a biophysical profile of 8/8.  Her 35-week group B Streptococcus  is unknown.   PHYSICAL EXAMINATION:  GENERAL APPEARANCE:  No acute distress.  VITAL SIGNS:  Current blood pressure 138/85.  HEENT:  Negative.  LUNGS:  Clear.  HEART:  Normal.  ABDOMEN:  Gravid and nontender.  The fundal height is 2 cm above gestational  age at 37 cm.  Vertex presentation by [employer].  PELVIC:  The vaginal exam reveals an external os which is  fingertip and  closed internal os.  Long cervix.  High presenting part.  EXTREMITIES:  2+ pitting edema.  NEUROLOGIC:  Deep tendon reflexes 2/4.  No clonus.   ASSESSMENT:  1. Intrauterine pregnancy at 35 weeks and 2 days with known chronic     hypertension with superimposed mild preeclampsia.  2. Insulin-dependent gestational diabetes, well controlled.   PLAN:  The patient is admitted for complete bed rest and monitoring of her  preeclampsia.  She will undergo a repeat set of PIH labs, as well as a 24-  hour urine.  We will reevaluate placental function with a Doppler study, as  well as an amniotic fluid index and a biophysical profile.  Currently the  fetal heart rate tracing is reactive and reassuring.  Due to her  prematurity, as well as insulin-dependent diabetes, we will plan for  expectant management unless any marker of severity appears in her  preeclampsia.  If it becomes needed to induce, we will plan Cytotec ripening  before induction.  We are also planning to do a fetal lung maturity study at  36-37 weeks.  All of this has been fully reviewed with the patient in the  presence of her mother.                                               Crist Fat Rivard, M.D.    SAR/MEDQ  D:  01/05/2003  T:  01/05/2003  Job:  [id number]

## 2011-02-24 NOTE — Discharge Summary (Signed)
[name], [name]                           ACCOUNT NO.:  [id number]   MEDICAL RECORD NO.:  [id number]                   PATIENT TYPE:  INP   LOCATION:  9135                                 [facility]ACILITY:  [facility]   PHYSICIAN:  Janine Limbo, M.D.            DATE OF BIRTH:  January 30, 1976   DATE OF ADMISSION:  01/05/2003  DATE OF DISCHARGE:  01/17/2003                                 DISCHARGE SUMMARY   HISTORY OF PRESENT ILLNESS:  The patient is a 35 year old married white  female gravida 1, para 0 with a due date by ultrasound of Feb 08, 2003.  She  was at 35 weeks and 2 days upon admission, and the admission was for  antenatal monitoring of superimposed preeclampsia over chronic hypertension.  She had been known for chronic hypertension for a few years with antenatal  modification of her hypertensive to Aldomet 250 mg q.i.d.  She was using  labetalol 300 mg t.i.d. with overall good control over her hypertension.  She reported an unusual headache, which appeared bilateral and retro-orbital  and had not responded to her usual dose of Tylenol.  She denied visual  symptoms, right upper quadrant pain, denied contractions, leaking of fluid,  or bleeding.  Her PIH labs on January 02, 2003, were all within normal limits.  Her pregnancy has been followed by [facility] M[facility],  noted to be remarkable for (1) chronic hypertension, (2) obesity, (3) mild  asthma, (4) heart murmur, which requires antibiotic prophylaxis for dental  work, (5) status post tonsillectomy, (6) status post carpal tunnel  correction bilaterally.  She was admitted on bedrest, and PIH labs and 24-  hour urine were collected, which were normal.  Her ultrasound showed 8/8 for  a biophysical profile with AFI of 9.1 and normal Dopplers.  Dr. Estanislado Pandy  offered induction upon admission or expectant management with close  monitoring, and patient elected expectant management at that time.   By her second hospital stay,  the protein in her 24-hour urine was 957 mg,  which was a slight increase over her 24-hour urine results on January 01, 2003.  Her blood pressure remained stable.  Fetal heart rate tracing  remained reactive.  Dr. Stefano Gaul offered the patient amniocentesis for a  lung maturity study in hopes of being able to proceed with induction of  labor.  The results of the lung maturity study, the PG was negative, and the  plan was made to continue observation status.  Patient remained essentially  stable in lab work, vital signs, and fetal status.  Her labs became slightly  elevated, the liver function studies, on January 12, 2003, and her 24-hour  urine on that same day showed 2730 mg of protein.  Dr. Pennie Rushing discussed the  recommendation to induce her labor secondary to the worsening proteinuria  and the new increase in liver function tests.  The family consented to  proceed with Cytotec for cervical ripening as well as mag sulfate therapy  for seizure prophylaxis.   By the morning of January 13, 2003, she was status post Cytotec and was on  Pitocin with no progression in labor and minimal contractions.  She also had  a new development of thrombocytopenia, and liver function studies were  continuing to increase.  Her diabetes was well controlled with insulin.  The  discussion was had with the patient by Dr. Su Hilt to repeat lab work at  noon and proceed with cesarean section if the lab studies continued to  decline.  Patient and family agreed with that plan, and the lab work  encouraged the progression to a cesarean section, which was done by Dr.  Su Hilt with Vance Gather Duplantis, C.N.M., as first assistant.  Patient  tolerated the procedure well and was taken to AICU where she was receiving  mag sulfate.  A viable female infant was delivered with Apgars of 6 and 8;  she was taken to NICU secondary to hypotonia.   By postop day number one, patient was stable and was diuresing well.  Her  labs remained  stable.  During her postpartum stay, she required occasional  doses of labetalol IV to control her blood pressure.   By postop day two, her labs were worsening; her liver function tests  continued to rise, and her platelets continued to fall.  She was diuresing  well, and her glucose was well controlled.  By the evening of postop day  two, her preeclampsia was improving slightly, as shown by her repeat blood  work.   By postop day number three, she continued to do well; her lab work was  improving, her catheter was taken out, and her magnesium sulfate was  discontinued, and she was transferred to the floor.  Infant remained  upstairs in the NICU.   By postop day number four, patient continued to do well; her JP drain was  removed, lab work continued to improve.  Dr. Stefano Gaul planned for her to  take Procardia XL 30 mg to take in addition to her labetalol to control her  blood pressure at home.  She was deemed to have received the full benefit of  her hospital stay and was to be discharged home in the evening of January 17, 2003.   DISCHARGE INSTRUCTIONS:  Per [facility] handout.   DISCHARGE MEDICATIONS:  1. Motrin 600 mg one p.o. q.6h. p.r.n. pain.  2. Tylox one to two p.o. q.3-4h. p.r.n. pain.  3. Prenatal vitamins one p.o. q.d.  4. Labetalol 200 mg p.o. t.i.d.  5. Procardia XL 30 mg p.o. q.d.   FOLLOW UP:  Discharge followup will occur at [facility] in one  to two weeks for a blood pressure check.     Cam Hai, C.N.M.                     Janine Limbo, M.D.    KS/MEDQ  D:  01/17/2003  T:  01/17/2003  Job:  [id number]

## 2011-02-24 NOTE — Discharge Summary (Signed)
   NAMEGREENLEY, MARTONE                           ACCOUNT NO.:  [id number]   MEDICAL RECORD NO.:  [id number]                   PATIENT TYPE:  INP   LOCATION:  9135                                 [facility]ACILITY[facility]   PHYSICIAN:  Janine Limbo, M.D.            DATE OF BIRTH:  Jul 23, 1976   DATE OF ADMISSION:  01/05/2003  DATE OF DISCHARGE:  01/17/2003                                 DISCHARGE SUMMARY   HISTORY OF PRESENT ILLNESS:  The patient is a 35 year old married white  female, gravida 1, para 0, with an EDC by ultrasound of Feb 08, 2003.  She  was admitted at 35 weeks and 2 days for antenatal monitoring of superimposed  preeclampsia over chronic hypertension.  She had been known for chronic  hypertension for a few years and with antenatal modification of her  hypertensive to Aldomet 250 mg q.i.d.  Upon admission, she was currently  using labetalol 300 mg t.i.d. with overall good control over her  hypertension.  Upon admission, she was complaining of an unusual headache,  which was bilateral, retro-orbital, and had not responded to her usual 1000  mg of Tylenol.  She denied visual symptoms.  She denied right upper quadrant  pain.  She reported good fetal activity.  Her pregnancy has been followed by  [facility] M.D[facility] and has been remarkable for (1)  chronic hypertension, (2) obesity, (3) mild asthma, (4) heart murmur, which  requires antibiotic prophylaxis for dental work, (5) status post  tonsillectomy, (6) status post carpal tunnel correction bilaterally.   Dictation ended at this point.     Cam Hai, C.N.M.                     Janine Limbo, M.D.    KS/MEDQ  D:  01/17/2003  T:  01/17/2003  Job:  [id number]

## 2011-02-24 NOTE — Op Note (Signed)
NAMEJACQUELYNN, FRIEND                           ACCOUNT NO.:  [id number]   MEDICAL RECORD NO.:  [id number]                   PATIENT TYPE:  INP   LOCATION:  9153                                 [facility]   PHYSICIAN:  Janine Limbo, M.D.            DATE OF BIRTH:  01-08-1976   DATE OF PROCEDURE:  01/07/2003  DATE OF DISCHARGE:                                 OPERATIVE REPORT   PREOPERATIVE DIAGNOSES:  1. Thirty-five and a half weeks' gestation.  2. Preeclampsia.  3. Gestational diabetes.  4. Rule out prematurity.   POSTOPERATIVE DIAGNOSES:  1. Thirty-five and a half weeks' gestation.  2. Preeclampsia.  3. Gestational diabetes.  4. Rule out prematurity.   PROCEDURE:  Amniocentesis for maturity studies.   SURGEON:  Janine Limbo, M.D.   ANESTHESIA:  Local 1% Xylocaine.   DISPOSITION:  The patient is a 35 year old female, gravida 1, para 0, who  presents at 10 and 4/7 weeks' gestation.  This pregnancy has been followed  by the [facility] of [facility].  Her pregnancy has been complicated by chronic hypertension.  She also has  now developed superimposed preeclampsia.  She was noted to have 957 mg of  protein in a 24-hour ample period.  In addition, the patient has gestational  diabetes requiring insulin.  The patient understands the indications for her  amniocentesis and she accepts the risks of, but not limited to, anesthetic  complications, bleeding, infection, and possible damage to the surrounding  organs.   FINDINGS:  An ultrasound was performed in the patient's room by Janine Limbo, M.D.  She was noted to have a single intrauterine gestation and a  cephalic presentation.  The placenta is posterior and grade 2.  The amniotic  fluid volume is normal.  Fetal heart motions were noted to be normal.  The  patient's blood type is A-positive.  The fluid removed on amniocentesis was  noted to be clear.   PROCEDURE:  The patient was placed in a supine position.  The ultrasound was  performed and an appropriate fluid pocket was isolated.  The patient's  abdomen was prepped with multiple layers of Hibiclens and then sterilely  draped.  Three mL of 1% Xylocaine without epinephrine was injected into the  skin.  The spinal needle was inserted without difficulty.  Approximately 20  mL of clear amniotic fluid was removed.  The patient tolerated her procedure  well.  The estimated blood loss was 0.  The sample was sent to the lab where it  will be transported to [facility] for the L/S ratio and for PG.  The patient was placed back on the nonstress test monitor and was noted to  have a reactive nonstress test.  Janine Limbo, M.D.    AVS/MEDQ  D:  01/07/2003  T:  01/07/2003  Job:  [id number]

## 2011-02-24 NOTE — H&P (Signed)
[name], [name]               ACCOUNT NO.:  [id number]   MEDICAL RECORD NO.:  [id number]          PATIENT TYPE:  INP   LOCATION:  9156                          FACILITY:  [facility]   PHYSICIAN:  Osborn Coho, M.D.   DATE OF BIRTH:  Mar 03, 1976   DATE OF ADMISSION:  05/04/2005  DATE OF DISCHARGE:                                HISTORY & PHYSICAL   This is a 35 year old gravida 2, para 0-1-0-1, at 35-2/7 weeks, who presents  from the office with elevated blood pressure.  Her pregnancy has been  followed by Dr. Estanislado Pandy and remarkable for:   1.  Chronic hypertension.  2.  History of severe preeclampsia.  3.  Obesity.  4.  Insulin-dependent adult-onset diabetes mellitus.  5.  Oligohydramnios.  6.  Previous C-section.   PAST OBSTETRICAL HISTORY:  Remarkable for primary low transverse cesarean  section in 2004 of a female infant at 75 weeks' gestation weighing 4 pounds  12 ounces.  Remarkable for severe preeclampsia and failed induction and  HELLP syndrome.   PAST MEDICAL HISTORY:  Remarkable for severe preeclampsia, chronic  hypertension, adult-onset diabetes which is now insulin-requiring, childhood  varicella, history of asthma.   PAST SURGICAL HISTORY:  Remarkable for a tonsillectomy, bilateral carpal  tunnel and C-section x1.   FAMILY HISTORY:  Remarkable for grandmother and mother with varicosities,  sister with anemia, mother and grandmother with diabetes, mother with an  ulcer, sister with epilepsy, grandmother with brain cancer.   GENETIC HISTORY:  The patient has a mother and father who have heart  murmurs.   SOCIAL HISTORY:  The patient is married to [facility], who is involved and  supportive.  She does not report a religious affiliation.  She works as a  Production designer, theatre/television/film, and he works as a Curator.  She denies any alcohol,  tobacco or drug use.   PRENATAL LABORATORY DATA:  Hemoglobin 13.2, platelets 337, blood type A  positive.  Antibody screen negative.  RPR  nonreactive.  Rubella immune.  Hepatitis negative.  Pap is normal.  Gonorrhea and Chlamydia both declined.  Cystic fibrosis and quad screen declined.   HISTORY OF CURRENT PREGNANCY:  The patient entered care at 10 weeks'  gestation.  She had some initial proteinuria.  A 24-hour urine was sent.  Her sugars were generally well-controlled at the beginning of the pregnancy.  Insulin was increased at 11 weeks to cover her sugar requirements and again  some time later.  She had ultrasound at 18 weeks showing normal anatomy,  anterior placenta, and normal AFI.  Cervix was 3.6 cm long at that point.  She was initially desiring a VBAC and a consent form was given to her and  signed.  Echocardiogram was done, which was normal.  Insulin was increased  several times throughout the pregnancy.  She had an ultrasound at 29 weeks  showing 50-90th percentile growth and normal fluid.  Blood pressure medicine  was changed at 31 weeks to cover an elevated blood pressure of 138/94.  She  was increased at that time Aldomet to 500 mg twice a day,  and then she  presents today with elevated blood pressures despite being on twice a day  Aldomet with the addition of twice a day labetalol.  She is also on NPH and  regular insulin several times per day.   OBJECTIVE:  VITAL SIGNS:  Stable.  Blood pressure 148/92 on admission.  Weight is 292.  HEENT:  Within normal limits.  She denies headache.  NECK:  Thyroid normal, not enlarged.  CHEST:  Clear to auscultation.  CARDIAC:  Regular rate and rhythm.  ABDOMEN:  Gravid.  EFM shows fetal heart rate 145-150, nonreactive at  present, with no contractions.  PELVIC:  Cervix exam is deferred.  EXTREMITIES:  1+ edema in her feet and trace in her hands, and DTRs of 3+  with no clonus.   A 24-hour urine is pending.   ASSESSMENT:  1.  Intrauterine pregnancy at 35-2/7 weeks.  2.  Pregnancy-induced hypertension/chronic hypertension.  3.  Adult-onset diabetes,  insulin-requiring.   PLAN:  1.  Admit to antenatal per Dr. Estanislado Pandy.  2.  Routine antenatal orders.  3.  Blood pressure medications per M.D.  4.  M.D. to follow.       MLW/MEDQ  D:  05/04/2005  T:  05/04/2005  Job:  [id number]

## 2011-02-24 NOTE — H&P (Signed)
[name], [name]               ACCOUNT NO.:  [id number]   MEDICAL RECORD NO.:  [id number]          PATIENT TYPE:  INP   LOCATION:  NA                            [facility]  [facility]   PHYSICIAN:  Hal Morales, M.D.DATE OF BIRTH:  1975/12/07   DATE OF ADMISSION:  DATE OF DISCHARGE:                                HISTORY & PHYSICAL   Ms. [name] is a 35 year old married white female, gravida 2, para 0-1-0-1,  at 36-4/7 weeks, who presents for evaluation of elevated blood pressure and  return of 24 hour urine.  She denies headache, nausea, and vomiting or  visual disturbances.  Her pregnancy has been followed by the [facility] and has been remarkable for:  (1) Chronic  hypertension.  (2) Obesity.  (3) Asthma.  (4) History of severe  preeclampsia.  (5) Insulin-dependent diabetes.  (6) Previous C-section.  (7)  Group B strep pending from May 11, 2005.   Her prenatal labs were collected on November 09, 2004.  Hemoglobin 13.2,  platelets 337,000, blood type A positive, antibody negative, RPR  nonreactive, rubella immune, hepatitis B surface antigen negative, Pap smear  within normal limits.  Culture of the vaginal tract for group B strep on  May 11, 2005, is pending.   HISTORY OF PRESENT PREGNANCY:  The patient presented for care at [facility] on November 09, 2004, at 10-1/[redacted] weeks gestation.  The patient  insulin shortly after learning of her pregnancy because of documented  hyperglycemia, and was on an ADA diet.  The patient was also taking Aldomet  500 mg b.i.d. from the start of the pregnancy.  In the first trimester, the  patient's NPH was increased to 18 units, Aldomet 500 mg b.i.d. continued to  maintain her blood pressure in a normal range.  Ultrasonography at 18 weeks  shows growth consistent with previous dating, confirming [facility] of June 05, 2005.  Ultrasonography at [redacted] weeks gestation showed appropriate growth.  Echocardiogram was performed,  was normal but limited view of aortic arch.  At [redacted] weeks gestation, regular insulin of 4 units was added prior to dinner,  and her Aldomet continued to maintain her blood pressure.  The patient  started biweekly NSTs at [redacted] weeks gestation.  Her Aldomet was increased to  t.i.d. at 32 weeks.  Pregnancy ultrasonography at 34 weeks showed estimated  fetal weight in the 61st percentile with normal fluid, BPP 8/8.  At 36  weeks, because of increasing difficulty controlling her blood pressure, the  patient was instructed to collect a 24 hour urine, and she presents today to  return that to rule out preeclampsia.   OBSTETRICAL HISTORY:  She is a gravida 2, para 0-1-0-1.  In April 2004, she  had a C-section for a female infant weighing 4 pounds and 12 ounces at 36-  2/[redacted] weeks gestation.  She had a spinal for her anesthesia.  She had severe  preeclampsia and HELLP syndrome with a failed induction.  Infant's name was  Granville Lewis.   MEDICAL HISTORY:  1.  She is allergic to DARVOCET.  2.  She experienced menarche at the age of 73 with 28 days cycles lasting 3-      4 days.  3.  She has used oral contraceptives in the past.  4.  She reports having had the usual childhood illnesses.  5.  The patient has been diagnosed with chronic hypertension as well as      asthma requiring Pulmicort and Rhinocort.  6.  She also has adult onset diabetes.   SURGICAL HISTORY:  Remarkable for tonsillectomy as well as carpal tunnel  syndrome and C-section in 2004.   FAMILY MEDICAL HISTORY:  Remarkable for maternal grandmother and mother with  varicosities, sister with anemia, multiple family members with diabetes,  sister with epilepsy.  Paternal grandmother with brain cancer.   GENETIC HISTORY:  Remarkable for parents both with heart murmurs requiring  antibiotics.   SOCIAL HISTORY:  The patient is married to the father of the baby.  His name  is Tammy Sours.  He is involved and supportive.  The patient has a bachelor's   degree and is employed full-time as a Data processing manager.  The father of the baby  is high-school educated and employed full-time as a Curator.  They deny any  alcohol, tobacco, or illicit drug use with the pregnancy.   OBJECTIVE DATA:  VITAL SIGNS:  Blood pressures are 179/108, 161/100.  Other  vital signs are stable.  She is afebrile.  HEENT:  Grossly within normal limits.  CHEST:  Clear to auscultation.  HEART:  Regular rate and rhythm.  ABDOMEN:  Gravid and contour with fundal height extending approximately 38  cm above the pubic symphysis.  Fetal heart rate is reactive and reassuring,  no contractions.  Cervix is deferred.  EXTREMITIES:  With trace edema.  No clonus and DTRs 2+.   A 24 hour urine shows total protein of 540, SGOT 21, SGPT 18, LDH 162, uric  acid 6.3, hemoglobin 13.1, platelets 215,000, white blood cell count 8.4.  CBG is 58.   ASSESSMENT:  1.  Intrauterine pregnancy at 36-4/7 weeks.  2.  Chronic hypertension, now  with superimposed preeclampsia.  3.  Insulin-dependent gestational diabetes.  4.  Previous cesarean section.   PLAN:  1.  MD to see and evaluate patient, planning C-section delivery for today.  2.  Initiate Glucomander protocol.       KS/MEDQ  D:  05/13/2005  T:  05/13/2005  Job:  [id number]

## 2011-02-24 NOTE — Discharge Summary (Signed)
[name], [name]               ACCOUNT NO.:  [id number]   MEDICAL RECORD NO.:  [id number]          PATIENT TYPE:  INP   LOCATION:  9104                          [facility]ACILITY:  [facility]   PHYSICIAN:  Naima A. Dillard, M.D. DATE OF BIRTH:  Nov 26, 1975   DATE OF ADMISSION:  05/13/2005  DATE OF DISCHARGE:                                 DISCHARGE SUMMARY   ADMITTING DIAGNOSES:  1.  Intrauterine pregnancy at 36 and four-sevenths weeks.  2.  Chronic hypertension with superimposed preeclampsia.  3.  Previous cesarean section.  4.  Insulin-dependent gestational diabetes mellitus.   PROCEDURE:  Repeat low transverse cesarean section.   DISCHARGE DIAGNOSES:  1.  Intrauterine pregnancy at [redacted] weeks gestation.  2.  Chronic hypertension.  3.  Superimposed preeclampsia.  4.  Insulin-dependent gestational diabetes.  5.  Repeat low transverse cesarean section.   [name] is a 35 year old gravida 2 para 1 who presented at 13 and four-  sevenths weeks with gestational diabetes insulin dependent, chronic  hypertension, and newly diagnosed superimposed preeclampsia based on a 24-  hour urine specimen with greater than 500 mg of protein per 24 hours.  Recommendation for delivery for management of preeclampsia was discussed  with the patient and after discussion with Dr. Dierdre Forth the patient  opted for repeat cesarean delivery. This was performed by Dr. Dierdre Forth on May 13, 2005 with the birth of a 6-pound 3-ounce female infant  named [name] with Apgar scores of 8 at one minute and 9 at five minutes. The  patient has done well postoperatively. Her vital signs have been stable. She  has been afebrile. Her blood sugars have been in good control within the  normal range as of the second postoperative day. She remained on magnesium  sulfate for 24 hours and diuresed well. However, when magnesium was  discontinued the patient had very labile blood pressures which became  difficult to manage, at  times being 160s to 180s over 100s to 120s. Her  blood pressure medicine was changed several times and on May 16, 2005  [facility] was consulted for management of the  patient's hypertension. She was seen by [facility] on May 16, 2005 with  change in her blood pressure medicine which has regained control of her  blood pressures. Her incision is clean, dry and intact. She has remained  afebrile, and on this her fourth postoperative day she is judged in  satisfactory condition for discharge.   DISCHARGE INSTRUCTIONS:  Per [facility] handout.   DISCHARGE MEDICATIONS:  1.  Hydrochlorothiazide 25 mg p.o. daily.  2.  Hydralazine 50 mg p.o. q.i.d.  3.  Motrin 600 mg p.o. q.6h. p.r.n. pain.  4.  Tylox one to two p.o. q.3-4h. pain.  5.  Prenatal vitamins.   The patient will follow up at [facility] in 1 week and she will  monitor her blood pressure at home and also have blood pressure checked by  her Siri Cole nurse this week. The patient will call for any symptoms such  as headache, visual changes, or epigastric pain;  any fever, abdominal pain  or any problems or concerns.      Rica Koyanagi, C.N.M.      Naima A. Normand Sloop, M.D.  Electronically Signed    SDM/MEDQ  D:  05/17/2005  T:  05/17/2005  Job:  [id number]

## 2011-02-24 NOTE — Discharge Summary (Signed)
[name], [name]               ACCOUNT NO.:  [id number]   MEDICAL RECORD NO.:  [id number]          PATIENT TYPE:  INP   LOCATION:  9156                          [facility]ILITY:  [facility]   PHYSICIAN:  Janine Limbo, M.D.DATE OF BIRTH:  [birth year]   DATE OF ADMISSION:  05/04/2005  DATE OF DISCHARGE:  05/06/2005                                 DISCHARGE SUMMARY   ADMISSION DIAGNOSES:  1.  Intrauterine pregnancy at 35-2/7 weeks.  2.  Pregnancy induced hypertension/chronic hypertension.  3.  Adult onset diabetes, insulin requiring.   DISCHARGE DIAGNOSES:  1.  Intrauterine pregnancy at 35-2/7 weeks.  2.  Pregnancy induced hypertension/chronic hypertension.  3.  Adult onset diabetes, insulin requiring.  4.  Reassuring fetal status.   PROCEDURES:  1.  Electronic failed monitoring.  2.  Ultrasound.  3.  Diabetic monitoring.   HOSPITAL COURSE:  The patient was admitted with elevation of blood pressure  of 148/92.  She was admitted for hypertension and to rule out pregnancy  induced hypertension.  She had labs done and 24-hour urine was done.  Her pH  labs were normal with platelet count of 219,000, AST 20, ALT 16, uric acid  5.8 and 24-hour urine resulted at 196 mg per 24 hours.  On hospital day #2,  she was doing well.  NST was reactive.  Blood pressures were 131-149 to 75-  89 on Labetalol.  Fasting blood sugars were 73 and 74.  Post prandial blood  sugars were 75, 123 and 86.  Ultrasound was done showing single intrauterine  pregnancy cephalic presentation, normal fluid with an AFI of 12.4 and growth  which represented 50-75 percentile.  She was deemed to have received the  full benefit of her hospital stay, given good blood pressure control and  reassuring fetal status and was discharged home.   DISCHARGE MEDICATIONS:  Labetalol 400 mg p.o. b.i.d.   DISCHARGE LABORATORIES:  White blood cell count 9.4, hemoglobin 12.8,  platelets 227,000.  RPR nonreactive.  Chemistries normal.  SGOT  20, SGPT 16,  LDH 180, uric acid 5.7.   DISCHARGE INSTRUCTIONS:  The patient will continue bed rest and monitor for  signs of preeclampsia.   DISCHARGE FOLLOWUP:  The patient will have an NST on May 08, 2005, and  follow up in the office as scheduled and p.r.n. as indicated.       MLW/MEDQ  D:  05/06/2005  T:  05/06/2005  Job:  [id number]

## 2011-02-24 NOTE — Op Note (Signed)
NAMEJAYANI, [name]                           ACCOUNT NO.:  [id number]   MEDICAL RECORD NO.:  [id number]                   PATIENT TYPE:  INP   LOCATION:  9179                                 [facility]ACILITY:  [facility]   PHYSICIAN:  Osborn Coho, M.D.                DATE OF BIRTH:  01-May-1976   DATE OF PROCEDURE:  01/13/2003  DATE OF DISCHARGE:                                 OPERATIVE REPORT   PREOPERATIVE DIAGNOSES:  1. Intrauterine pregnancy at 36 weeks 2 days.  2. Chronic hypertension.  3. Superimposed severe preeclampsia.  4. Gestational diabetes on insulin.  5. Thrombocytopenia.   POSTOPERATIVE DIAGNOSES:  1. Intrauterine pregnancy at 36 weeks 2 days.  2. Chronic hypertension.  3. Superimposed severe preeclampsia.  4. Gestational diabetes on insulin.  5. Thrombocytopenia.   PROCEDURE:  Primary low transverse cesarean section via Pfannenstiel skin  incision.   ANESTHESIA:  Spinal.   SURGEON:  Osborn Coho, M.D.   ASSISTANT:  Concha Pyo. Duplantis, C.N.M.   FLUIDS REPLACED:  800 mL.   ESTIMATED BLOOD LOSS:  800 mL.   URINE OUTPUT:  200 mL.   COMPLICATIONS:  None.   FINDINGS:  A live female infant with Apgars of 6 at one minute and 8 at five  minutes.  Infant to NICU secondary to hypotonia.  Placenta to pathology.   DESCRIPTION OF PROCEDURE:  The patient was taken to the operating room after  the risks, benefits, and alternatives were discussed with the patient.  The  patient verbalized understanding and consent reaffirmed.  The patient was  taken to the operating room and given a spinal and prepped and draped in the  normal sterile fashion.  A Pfannenstiel skin incision was made and carried  down to the underlying layer of fascia with the Bovie.  The Bovie was used  to excise the fascia bilaterally to through the midline and the incision was  extended bilaterally with the Mayo scissors.  Kocher clamps were placed on  the inferior aspect of the fascial incision and  the rectus muscle excised  from the fascia.  The same was done on the superior aspect of the fascial  incision.  The muscle was separated in the midline bluntly and superiorly  with the Bovie.  The peritoneum was entered bluntly.  The bladder blade was  placed and the bladder flap created with the Metzenbaum scissors.  The  bladder blade was removed and replaced to protect the bladder.  The uterine  incision was made with the scalpel and extended bilaterally with the bandage  scissors.  The infant's head was delivered atraumatically and the oropharynx  and nasopharynx were bulb-suctioned.  There was a loose nuchal cord x1,  which was easily reduced, and the infant was delivered without difficulty.  The cord was clamped and cut and the infant was handed off to the awaiting  pediatricians.  Cord bloods were sent.  The placenta was sent to pathology.  The placenta was delivered via fundal massage and the uterus cleared of all  clots and debris.  A sponge stick was used to dilate the cervix.  The  uterine incision was repaired with 0 Vicryl in a running locked fashion.  A  second imbricating layer was performed using 0 Vicryl.  The intra-abdominal  cavity was copiously irrigated and the uterine incision was found to be  hemostatic.  The peritoneum was closed with 0 chromic in a running fashion.  The muscle was reapproximated with 0 Vicryl using interrupteds x4.  The  subcutaneous tissue was irrigated and made hemostatic with the Bovie.  A JP  drain was placed in the subcutaneous layer and the subcutaneous layer was  reapproximated with 0 plain.  The skin was closed with staples.  The patient  tolerated the procedure well.  Sponge, lap, and needle count were correct.  The patient was returned to the recovery room in stable condition.                                               Osborn Coho, M.D.    AR/MEDQ  D:  01/13/2003  T:  01/14/2003  Job:  [id number]

## 2011-02-24 NOTE — Op Note (Signed)
[name], [name]               ACCOUNT NO.:  [id number]   MEDICAL RECORD NO.:  [id number]          PATIENT TYPE:  INP   LOCATION:  9371                          [facility]   PHYSICIAN:  Hal Morales, M.D.DATE OF BIRTH:  [birth year]   DATE OF PROCEDURE:  05/13/2005  DATE OF DISCHARGE:                                 OPERATIVE REPORT   PREOPERATIVE DIAGNOSES:  1.  Intrauterine pregnancy at 27 weeks' gestation.  2.  Chronic hypertension.  3.  Superimposed preeclampsia.  4.  Insulin-dependent gestational diabetes.   POSTOPERATIVE DIAGNOSES:  1.  Intrauterine pregnancy at 44 weeks' gestation.  2.  Chronic hypertension.  3.  Superimposed preeclampsia.  4.  Insulin-dependent gestational diabetes.   OPERATION:  Repeat low transverse cesarean section.   SURGEON:  Hal Morales, M.D.   FIRST ASSISTANT:  Cam Hai, C.N.M.   ANESTHESIA:  Spinal.   ESTIMATED BLOOD LOSS:  750 cc.   COMPLICATIONS:  None.   FINDINGS:  The patient was delivered of a female infant whose name is [name] at  1520, weighing 6 pounds 3 ounces with Apgars of 8 and 9 at one and five  minutes, respectively.  The uterus opian at 15, 20, weighing 6 pounds 3  ounces with parts of eight and 91 and 5 minutes respectively. The had the  uterus was normal for the gravid state.  The ovaries could not be well  visualized secondary to adhesions from previous cesarean section.  The tubes  could likewise not be well-visualized.   DESCRIPTION OF PROCEDURE:  The patient was taken to the operating room after  appropriate identification and placed on the operating table.  After  placement of a spinal anesthetic, the abdomen was elevated to access the  area beneath the panniculus and taped in place. The abdomen and perineum  were prepped with multiple layers of Betadine, and a Foley catheter inserted  into the bladder and connected to straight drainage. The abdomen was draped  as a sterile field. After  assurance of adequate spinal anesthesia, the  suprapubic region was infiltrated with 20 cc of 0.25% Marcaine.  A  suprapubic incision was made in the abdomen and opened in layers.  The  peritoneum was entered and the bladder blade placed.  The uterus was incised  approximately 2 cm above the uterovesical fold, and that incision taken  laterally on either side with bandage scissors.  The infant was delivered  from the occipitotransverse position with the aid of a kiwi vacuum extractor  complicated by three pop offs.  Once the fetal head was delivered, the  remainder of the infant was delivered with a combination of expulsive  efforts from fundus and gentle traction from the operating surgeon.  The  cord was clamped and cut and the infant handed off to the awaiting  pediatricians.  The appropriate cord blood was drawn and placenta noted to  have separated from the uterus and was removed from the operative field.  The uterine incision was closed with a running interlocking suture of 0  Vicryl. An imbricating suture of 0  Vicryl  was placed.  Hemostasis was  achieved with several figure-of-eight sutures of 0  Vicryl.  Copious  irrigation was carried out.  The abdominal peritoneum closed with a running  suture of 2-0 Vicryl.  The rectus muscles were reapproximated in the midline  with figure-of-eight suture of 2-0 Vicryl.  The rectus fascia was then  closed with a running suture of 0 Vicryl, then reinforced on either side of  midline with figure-of-eight sutures of 0 Vicryl.  The subcutaneous tissue  was copiously irrigated and made hemostatic with Bovie cautery.  A 7 mm  Jackson-Pratt drain was then placed in the subcutaneous tissue and exited  through a left lower quadrant stab wound.  It was sewn in place with a 0  silk suture.  A subcuticular suture of 3-0 Monocryl was used to close the  skin incision.  A sterile dressing was then applied and the patient taken  from the operating room to the  recovery room in satisfactory condition  having tolerated the procedure well with sponge and instrument counts  correct. The infant went to the full-term nursery.       VPH/MEDQ  D:  05/13/2005  T:  05/14/2005  Job:  [id number]

## 2011-07-13 LAB — CREATININE CLEARANCE, URINE, 24 HOUR
Collection Interval-CRCL: 24 hours
Creatinine Clearance: 173 mL/min — ABNORMAL HIGH (ref 75–115)
Creatinine, 24H Ur: 1768 mg/d (ref 700–1800)
Creatinine, Urine: 172.5 mg/dL

## 2011-07-13 LAB — GLUCOSE, CAPILLARY
Glucose-Capillary: 104 mg/dL — ABNORMAL HIGH (ref 70–99)
Glucose-Capillary: 108 mg/dL — ABNORMAL HIGH (ref 70–99)
Glucose-Capillary: 113 mg/dL — ABNORMAL HIGH (ref 70–99)
Glucose-Capillary: 136 mg/dL — ABNORMAL HIGH (ref 70–99)
Glucose-Capillary: 63 mg/dL — ABNORMAL LOW (ref 70–99)
Glucose-Capillary: 64 mg/dL — ABNORMAL LOW (ref 70–99)
Glucose-Capillary: 75 mg/dL (ref 70–99)
Glucose-Capillary: 75 mg/dL (ref 70–99)
Glucose-Capillary: 77 mg/dL (ref 70–99)
Glucose-Capillary: 82 mg/dL (ref 70–99)

## 2011-07-13 LAB — CBC
HCT: 35.5 % — ABNORMAL LOW (ref 36.0–46.0)
Hemoglobin: 12.1 g/dL (ref 12.0–15.0)
Hemoglobin: 12.2 g/dL (ref 12.0–15.0)
MCHC: 33.7 g/dL (ref 30.0–36.0)
MCHC: 33.7 g/dL (ref 30.0–36.0)
MCHC: 33.8 g/dL (ref 30.0–36.0)
MCV: 88.1 fL (ref 78.0–100.0)
MCV: 88.4 fL (ref 78.0–100.0)
MCV: 89.4 fL (ref 78.0–100.0)
Platelets: 203 10*3/uL (ref 150–400)
Platelets: 211 10*3/uL (ref 150–400)
Platelets: 236 10*3/uL (ref 150–400)
RBC: 3.64 MIL/uL — ABNORMAL LOW (ref 3.87–5.11)
RBC: 4.1 MIL/uL (ref 3.87–5.11)
RBC: 4.13 MIL/uL (ref 3.87–5.11)
RDW: 13.8 % (ref 11.5–15.5)
WBC: 7.9 10*3/uL (ref 4.0–10.5)
WBC: 9 10*3/uL (ref 4.0–10.5)
WBC: 9.3 10*3/uL (ref 4.0–10.5)
WBC: 9.4 10*3/uL (ref 4.0–10.5)

## 2011-07-13 LAB — COMPREHENSIVE METABOLIC PANEL
ALT: 13 U/L (ref 0–35)
ALT: 15 U/L (ref 0–35)
AST: 17 U/L (ref 0–37)
Albumin: 2.3 g/dL — ABNORMAL LOW (ref 3.5–5.2)
Albumin: 2.4 g/dL — ABNORMAL LOW (ref 3.5–5.2)
BUN: 6 mg/dL (ref 6–23)
CO2: 24 mEq/L (ref 19–32)
Calcium: 8.8 mg/dL (ref 8.4–10.5)
Chloride: 105 mEq/L (ref 96–112)
Chloride: 110 mEq/L (ref 96–112)
Creatinine, Ser: 0.57 mg/dL (ref 0.4–1.2)
Creatinine, Ser: 0.59 mg/dL (ref 0.4–1.2)
Creatinine, Ser: 0.71 mg/dL (ref 0.4–1.2)
GFR calc Af Amer: >60 mL/min (ref >60)
GFR calc Af Amer: >60 mL/min (ref >60)
GFR calc non Af Amer: >60 mL/min (ref >60)
Glucose, Bld: 95 mg/dL (ref 70–99)
Potassium: 3.8 mEq/L (ref 3.5–5.1)
Sodium: 137 mEq/L (ref 135–145)
Sodium: 138 mEq/L (ref 135–145)
Total Bilirubin: 0.4 mg/dL (ref 0.3–1.2)
Total Bilirubin: 0.4 mg/dL (ref 0.3–1.2)
Total Protein: 5.8 g/dL — ABNORMAL LOW (ref 6.0–8.3)

## 2011-07-13 LAB — RAPID HIV SCREEN (WH-MAU): Rapid HIV Screen: NONREACTIVE

## 2011-07-13 LAB — RPR: RPR Ser Ql: NONREACTIVE

## 2011-07-13 LAB — BASIC METABOLIC PANEL
BUN: 9 mg/dL (ref 6–23)
CO2: 20 mEq/L (ref 19–32)
Calcium: 8.2 mg/dL — ABNORMAL LOW (ref 8.4–10.5)
Calcium: 8.3 mg/dL — ABNORMAL LOW (ref 8.4–10.5)
Creatinine, Ser: 0.52 mg/dL (ref 0.4–1.2)
Creatinine, Ser: 0.74 mg/dL (ref 0.4–1.2)
GFR calc Af Amer: >60 mL/min (ref >60)
GFR calc Af Amer: >60 mL/min (ref >60)
GFR calc non Af Amer: >60 mL/min (ref >60)
GFR calc non Af Amer: >60 mL/min (ref >60)
Sodium: 127 mEq/L — ABNORMAL LOW (ref 135–145)

## 2011-07-13 LAB — URINALYSIS, ROUTINE W REFLEX MICROSCOPIC
Bilirubin Urine: NEGATIVE
Ketones, ur: NEGATIVE mg/dL
Ketones, ur: NEGATIVE mg/dL
Nitrite: NEGATIVE
Nitrite: NEGATIVE
Nitrite: NEGATIVE
Protein, ur: NEGATIVE mg/dL
Protein, ur: NEGATIVE mg/dL
Specific Gravity, Urine: 1.01 (ref 1.005–1.030)
Urobilinogen, UA: 0.2 mg/dL (ref 0.0–1.0)
Urobilinogen, UA: 0.2 mg/dL (ref 0.0–1.0)

## 2011-07-13 LAB — LACTATE DEHYDROGENASE: LDH: 149 U/L (ref 94–250)

## 2011-07-13 LAB — URIC ACID: Uric Acid, Serum: 5.9 mg/dL (ref 2.4–7.0)

## 2011-08-07 ENCOUNTER — Ambulatory Visit: Payer: Self-pay | Admitting: Bariatrics

## 2011-08-17 ENCOUNTER — Ambulatory Visit: Payer: Self-pay | Admitting: Bariatrics

## 2011-09-09 ENCOUNTER — Ambulatory Visit: Payer: Self-pay | Admitting: Bariatrics

## 2011-10-10 ENCOUNTER — Ambulatory Visit: Payer: Self-pay | Admitting: Bariatrics

## 2011-11-10 ENCOUNTER — Ambulatory Visit: Payer: Self-pay | Admitting: Bariatrics

## 2011-11-16 ENCOUNTER — Ambulatory Visit: Payer: Self-pay | Admitting: Bariatrics

## 2011-11-16 LAB — COMPREHENSIVE METABOLIC PANEL
Alkaline Phosphatase: 61 U/L (ref 50–136)
Anion Gap: 12 (ref 7–16)
Bilirubin,Total: 0.3 mg/dL (ref 0.2–1.0)
Calcium, Total: 8.6 mg/dL (ref 8.5–10.1)
Chloride: 100 mmol/L (ref 98–107)
Co2: 27 mmol/L (ref 21–32)
EGFR (African American): >60
EGFR (Non-African Amer.): >60
Osmolality: 283 (ref 275–301)
SGPT (ALT): 34 U/L
Sodium: 139 mmol/L (ref 136–145)

## 2011-11-16 LAB — PROTIME-INR: INR: 0.9

## 2011-11-16 LAB — AMYLASE: Amylase: 34 U/L (ref 25–115)

## 2011-11-16 LAB — TSH: Thyroid Stimulating Horm: 2.41 u[IU]/mL

## 2011-11-16 LAB — FERRITIN: Ferritin (ARMC): 10 ng/mL (ref 8–388)

## 2011-11-16 LAB — HEPATIC FUNCTION PANEL A (ARMC): Bilirubin, Direct: <0.1 mg/dL (ref 0.00–0.20)

## 2011-11-16 LAB — CBC WITH DIFFERENTIAL/PLATELET
Basophil #: 0 10*3/uL (ref 0.0–0.1)
Basophil %: 0.6 %
HCT: 34.2 % — ABNORMAL LOW (ref 35.0–47.0)
HGB: 11.1 g/dL — ABNORMAL LOW (ref 12.0–16.0)
Lymphocyte %: 25.9 %
MCH: 26.6 pg (ref 26.0–34.0)
MCHC: 32.3 g/dL (ref 32.0–36.0)
Neutrophil #: 4.1 10*3/uL (ref 1.4–6.5)
RDW: 15.7 % — ABNORMAL HIGH (ref 11.5–14.5)

## 2011-11-16 LAB — MAGNESIUM: Magnesium: 1.3 mg/dL — ABNORMAL LOW

## 2011-11-16 LAB — PHOSPHORUS: Phosphorus: 3.5 mg/dL (ref 2.5–4.9)

## 2011-12-08 ENCOUNTER — Ambulatory Visit: Payer: Self-pay | Admitting: Bariatrics

## 2012-01-08 ENCOUNTER — Ambulatory Visit: Payer: Self-pay | Admitting: Bariatrics

## 2012-02-07 ENCOUNTER — Ambulatory Visit: Payer: Self-pay | Admitting: Bariatrics

## 2012-02-15 ENCOUNTER — Ambulatory Visit: Payer: Self-pay | Admitting: Bariatrics

## 2012-03-07 ENCOUNTER — Inpatient Hospital Stay: Payer: Self-pay | Admitting: Bariatrics

## 2012-03-08 LAB — BASIC METABOLIC PANEL
Anion Gap: 13 (ref 7–16)
Calcium, Total: 7.7 mg/dL — ABNORMAL LOW (ref 8.5–10.1)
Chloride: 103 mmol/L (ref 98–107)
Co2: 19 mmol/L — ABNORMAL LOW (ref 21–32)
Creatinine: 0.5 mg/dL — ABNORMAL LOW (ref 0.60–1.30)
EGFR (African American): >60
EGFR (Non-African Amer.): >60
Osmolality: 274 (ref 275–301)
Sodium: 135 mmol/L — ABNORMAL LOW (ref 136–145)

## 2012-03-08 LAB — CBC WITH DIFFERENTIAL/PLATELET
Eosinophil #: 0 10*3/uL (ref 0.0–0.7)
Eosinophil %: 0 %
HCT: 38.6 % (ref 35.0–47.0)
HGB: 12.7 g/dL (ref 12.0–16.0)
MCH: 28.6 pg (ref 26.0–34.0)
MCV: 87 fL (ref 80–100)
Monocyte #: 0.9 x10 3/mm (ref 0.2–0.9)
Monocyte %: 8.2 %
Neutrophil #: 9.2 10*3/uL — ABNORMAL HIGH (ref 1.4–6.5)
Neutrophil %: 81.7 %
Platelet: 363 10*3/uL (ref 150–440)

## 2012-04-18 ENCOUNTER — Ambulatory Visit: Payer: Self-pay | Admitting: Bariatrics

## 2012-07-17 ENCOUNTER — Other Ambulatory Visit: Payer: Self-pay | Admitting: Bariatrics

## 2012-07-17 LAB — COMPREHENSIVE METABOLIC PANEL
Alkaline Phosphatase: 99 U/L (ref 50–136)
Anion Gap: 6 — ABNORMAL LOW (ref 7–16)
BUN: 11 mg/dL (ref 7–18)
Bilirubin,Total: 0.6 mg/dL (ref 0.2–1.0)
Calcium, Total: 8.8 mg/dL (ref 8.5–10.1)
Chloride: 110 mmol/L — ABNORMAL HIGH (ref 98–107)
Co2: 26 mmol/L (ref 21–32)
Creatinine: 0.63 mg/dL (ref 0.60–1.30)
EGFR (Non-African Amer.): >60
Glucose: 119 mg/dL — ABNORMAL HIGH (ref 65–99)
Osmolality: 284 (ref 275–301)
Potassium: 3.9 mmol/L (ref 3.5–5.1)
SGPT (ALT): 28 U/L (ref 12–78)
Sodium: 142 mmol/L (ref 136–145)

## 2012-07-17 LAB — CBC WITH DIFFERENTIAL/PLATELET
Basophil %: 0.6 %
Eosinophil #: 0.2 10*3/uL (ref 0.0–0.7)
Eosinophil %: 3 %
HGB: 9.6 g/dL — ABNORMAL LOW (ref 12.0–16.0)
Lymphocyte #: 1.2 10*3/uL (ref 1.0–3.6)
Lymphocyte %: 23.2 %
MCH: 27.1 pg (ref 26.0–34.0)
MCHC: 32.9 g/dL (ref 32.0–36.0)
MCV: 83 fL (ref 80–100)
Monocyte #: 0.5 x10 3/mm (ref 0.2–0.9)
Neutrophil #: 3.3 10*3/uL (ref 1.4–6.5)
Neutrophil %: 64 %
RBC: 3.53 10*6/uL — ABNORMAL LOW (ref 3.80–5.20)

## 2012-07-17 LAB — AMYLASE: Amylase: 42 U/L (ref 25–115)

## 2012-07-17 LAB — FERRITIN: Ferritin (ARMC): 5 ng/mL — ABNORMAL LOW (ref 8–388)

## 2012-07-17 LAB — MAGNESIUM: Magnesium: 1.7 mg/dL — ABNORMAL LOW

## 2012-07-17 LAB — FOLATE: Folic Acid: 22.1 ng/mL (ref 3.1–100.0)

## 2015-01-31 NOTE — Op Note (Signed)
PATIENT NAME:  Sarah, Wolfe MR#:  [id number] DATE OF BIRTH:  1976-09-19  DATE OF PROCEDURE:  03/07/2012  PREOPERATIVE DIAGNOSES: Long-standing morbid obesity with a BMI of 50 associated with diabetes mellitus and hypertension along with reactive airway disease, cholelithiasis.  POSTOPERATIVE DIAGNOSES: Long-standing morbid obesity with a BMI of 50 associated with diabetes mellitus and hypertension along with reactive airway disease, cholelithiasis,  and small hiatal hernia.   PROCEDURE: Laparoscopic sleeve gastrectomy with repair of hiatal hernia and cholecystectomy.   SURGEON: Tyrone Apple. Alva Garnet, MD  ASSISTANT: Sanjuana Kava, PA.   DESCRIPTION OF PROCEDURE: The patient was brought to the Operating Room and placed in the supine position. General anesthesia was obtained with orotracheal intubation. The patient had somewhat difficult intubation, but no major consequence from this. The patient then had a Foley catheter inserted sterilely, TED hose and Thromboguards applied and a foot board placed in the operative bed. The patient then had prep and drape of the lower chest and abdomen. A 5 mm Optiview trocar was introduced under direct visualization, in the abdominal cavity, in the left upper quadrant. Pneumoperitoneum was established with carbon dioxide. Three additional trocars were introduced across the upper abdomen and a Nathanson liver retractor introduced through a subxiphoid wound. On inspection there was noted to be some broadening of the hiatus and identification of the GE junction was difficult secondary to perigastric fat pad. At this point multiple greater curvature vascular pedicles were divided by use of the Harmonic scalpel with full mobilization of the fundus from both the splenic bed and the undersurface of the left hemidiaphragm. At this point, there continued to be some evidence of a small hiatal hernia with the apex of the stomach lying just within the mediastinum. We decided to  proceed with repair of this in anticipation of sleeve gastrectomy because of the increased risk for reflux disease. The patient had division of portions of the gastrohepatic ligament with division of the peritoneum associated with the anterior hiatus. Blunt dissection was used to sweep the herniated peritoneum and lower esophagus away from the overlying pericardium. Blunt dissection laterally was used to initiate separation of the esophagus from the pleural surfaces. Residual phrenoesophageal ligament was divided by use of the Harmonic scalpel and the peritoneum was then divided just lateral to the right crus. Blunt dissection was used to complete mobilization of the distal esophagus away from the underlying aorta and the pleural surfaces. Eventually we were able to deliver approximately 2 cm of esophagus lying comfortably in the abdominal cavity. A posterior crural repair was then performed with two interrupted 0 Ethibond sutures. Next, the division of vascular pedicles was extended inferiorly to a point 4 cm proximal to the pylorus. A 34 French bougie was passed at this time as an aid in sizing of the gastric sleeve. A series of GI staplers were then used to create a medially based gastric sleeve. The first two firings were green load staples placed in a relative transverse direction in an effort to avoid any encroachment at the level of the incisura. Next, a more vertical line of staples were placed using seam guard staple line reinforcement. This was carried out to a point just lateral to the incisura leaving a small dog ear of fundus lateral to the GE junction. The bougie was slowly withdrawn as the staple process was performed in an effort to keep the staple line in a nice smooth consistent pattern. There was one small area of bleeding within the staple line, in the  mid stomach. This was oversewn with a single interrupted 2-0 Ethibond suture. At this point, attention was redirected to the gallbladder. The  gallbladder was elevated and diffuse omental adhesions to the gallbladder were taken down by use of the Harmonic scalpel. Eventually at the base of the gallbladder the cystic duct was isolated circumferentially. The cystic artery was divided along several of its branch points near the base of the gallbladder. Retrograde dissection of the gallbladder was then performed freeing it from the liver bed. At this point, the cystic duct was ligated by way of a 0 PDS Endoloop near its junction with the common bile duct. The cystic duct was transected. The gallbladder was brought up to the right upper abdominal 15 mm trocar wound. The gallbladder and a large isolated stone were then retrieved. The lateral stomach then retrieved at this point, through this particular wound. The fascia and peritoneum of this defect was then closed with a 0 Vicryl suture passed by way of a needle suture passing system. The staple line was again inspected as was the gallbladder fossa and hemostasis found to be excellent. The pneumoperitoneum was relieved. The trocars were removed. The wounds were injected with 0.25% Marcaine. 4-0 Monocryl was applied to the dermis followed by Dermabond. It should be noted that the lateral stomach was insufflated with air to determine its capacity and it was noted to hold 1.1 liters of fluid. At this point, the patient again was allowed to recover having tolerated the procedure well with estimated blood loss between 25 and 30 mL.  ____________________________ Tyrone AppleMichael A. Alva Garnetyner, MD mat:slb D: 03/07/2012 14:58:52 ET     T: 03/07/2012 15:40:52 ET        JOB#: [id number] cc:  Marya AmslerMarshall W. Dareen PianoAnderson, MD Cammy CopaPaul H. Juengel, MD Everette RankMICHAEL A Derl Abalos MD ELECTRONICALLY SIGNED 03/27/2012 21:39

## 2017-02-23 ENCOUNTER — Telehealth: Payer: Self-pay | Admitting: Obstetrics and Gynecology

## 2017-02-23 NOTE — Telephone Encounter (Signed)
Patient lvm in regards to her daughter Arcola JanskyDelaney Scrogham. Questioning why the patient wasn't contacted to schedule a f/u appointment. I lvm for patient call back to schedule an appointment. Thank you.

## 2017-08-24 DIAGNOSIS — E039 Hypothyroidism, unspecified: Secondary | ICD-10-CM | POA: Diagnosis not present

## 2017-08-29 DIAGNOSIS — E039 Hypothyroidism, unspecified: Secondary | ICD-10-CM | POA: Diagnosis not present

## 2017-10-09 DIAGNOSIS — I82409 Acute embolism and thrombosis of unspecified deep veins of unspecified lower extremity: Secondary | ICD-10-CM

## 2017-10-09 HISTORY — DX: Acute embolism and thrombosis of unspecified deep veins of unspecified lower extremity: I82.409

## 2017-10-17 ENCOUNTER — Other Ambulatory Visit: Payer: Self-pay | Admitting: Orthopedic Surgery

## 2017-10-17 DIAGNOSIS — M23222 Derangement of posterior horn of medial meniscus due to old tear or injury, left knee: Secondary | ICD-10-CM

## 2017-10-26 ENCOUNTER — Ambulatory Visit
Admission: RE | Admit: 2017-10-26 | Discharge: 2017-10-26 | Disposition: A | Discharge status: Home / Self Care | Payer: Commercial Managed Care - PPO | Source: Ambulatory Visit | Attending: Orthopedic Surgery | Admitting: Orthopedic Surgery

## 2017-10-26 ENCOUNTER — Encounter: Payer: Self-pay | Admitting: Radiology

## 2017-10-26 DIAGNOSIS — M23222 Derangement of posterior horn of medial meniscus due to old tear or injury, left knee: Secondary | ICD-10-CM

## 2017-10-26 DIAGNOSIS — M25461 Effusion, right knee: Secondary | ICD-10-CM | POA: Diagnosis not present

## 2017-10-30 ENCOUNTER — Other Ambulatory Visit: Payer: Self-pay | Admitting: Orthopedic Surgery

## 2017-10-30 ENCOUNTER — Ambulatory Visit
Admission: RE | Admit: 2017-10-30 | Discharge: 2017-10-30 | Disposition: A | Discharge status: Home / Self Care | Payer: Commercial Managed Care - PPO | Source: Ambulatory Visit | Attending: Orthopedic Surgery | Admitting: Orthopedic Surgery

## 2017-10-30 DIAGNOSIS — I82432 Acute embolism and thrombosis of left popliteal vein: Secondary | ICD-10-CM | POA: Insufficient documentation

## 2017-10-30 DIAGNOSIS — M79605 Pain in left leg: Secondary | ICD-10-CM

## 2017-11-06 ENCOUNTER — Encounter (INDEPENDENT_AMBULATORY_CARE_PROVIDER_SITE_OTHER): Payer: Self-pay | Admitting: Vascular Surgery

## 2017-11-06 ENCOUNTER — Ambulatory Visit (INDEPENDENT_AMBULATORY_CARE_PROVIDER_SITE_OTHER): Payer: Commercial Managed Care - PPO | Admitting: Vascular Surgery

## 2017-11-06 VITALS — BP 160/91 | HR 91 | Resp 16 | Ht 62.0 in | Wt 237.0 lb

## 2017-11-06 DIAGNOSIS — I10 Essential (primary) hypertension: Secondary | ICD-10-CM

## 2017-11-06 DIAGNOSIS — M23222 Derangement of posterior horn of medial meniscus due to old tear or injury, left knee: Secondary | ICD-10-CM | POA: Insufficient documentation

## 2017-11-06 DIAGNOSIS — I824Z2 Acute embolism and thrombosis of unspecified deep veins of left distal lower extremity: Secondary | ICD-10-CM | POA: Diagnosis not present

## 2017-11-06 DIAGNOSIS — I82409 Acute embolism and thrombosis of unspecified deep veins of unspecified lower extremity: Secondary | ICD-10-CM | POA: Insufficient documentation

## 2017-11-06 NOTE — Assessment & Plan Note (Signed)
blood pressure control important in reducing the progression of atherosclerotic disease. On appropriate oral medications.  

## 2017-11-06 NOTE — Assessment & Plan Note (Signed)
Needs to have surgery and has an active DVT which puts her at high risk for thromboembolic complications.

## 2017-11-06 NOTE — Patient Instructions (Signed)
Inferior Vena Cava Filter Insertion, Care After °This sheet gives you information about how to care for yourself after your procedure. Your health care provider may also give you more specific instructions. If you have problems or questions, contact your health care provider. °What can I expect after the procedure? °After your procedure, it is common to have: °· Mild pain in the area where the filter was inserted. °· Mild bruising in the area where the filter was inserted. ° °Follow these instructions at home: °Insertion site care °· Follow instructions from your health care provider about how to take care of the site where a catheter was inserted at your neck or groin (insertion site). Make sure you: °? Wash your hands with soap and water before you change your bandage (dressing). If soap and water are not available, use hand sanitizer. °? Change your dressing as told by your health care provider. °· Check your insertion site every day for signs of infection. Check for: °? More redness, swelling, or pain. °? More fluid or blood. °? Warmth. °? Pus or a bad smell. °· Keep the insertion site clean and dry. °· Do not shower, bathe, use a hot tub, or let the dressing get wet until your health care provider approves. °General instructions °· Take over-the-counter and prescription medicines only as told by your health care provider. °· Avoid heavy lifting or hard activities for 48 hours after the procedure or as told by your health care provider. °· Do not drive for 24 hours if you were given a a medicine to help you relax (sedative). °· Do not drive or use heavy machinery while taking prescription pain medicine. °· Do not go back to school or work until your health care provider approves. °· Keep all follow-up visits as told by your health care provider. This is important. °Contact a health care provider if: °· You have more redness, swelling, or pain around your insertion site. °· You have more fluid or blood coming  from your insertion site. °· Your insertion site feels warm to the touch. °· You have pus or a bad smell coming from your insertion site. °· You have a fever. °· You are dizzy. °· You have nausea and vomiting. °· You develop a rash. °Get help right away if: °· You develop chest pain, a cough, or difficulty breathing. °· You develop shortness of breath, feel faint, or pass out. °· You cough up blood. °· You have severe pain in your abdomen. °· You develop swelling and discoloration or pain in your legs. °· Your legs become pale and cold or blue. °· You develop weakness, difficulty moving your arms or legs, or balance problems. °· You develop problems with speech or vision. °These symptoms may represent a serious problem that is an emergency. Do not wait to see if the symptoms will go away. Get medical help right away. Call your local emergency services (911 in the [place] Do not drive yourself to the hospital. °Summary °· After your insertion procedure, it is common to have mild pain and bruising. °· Do not shower, bathe, use a hot tub, or let the dressing get wet until your health care provider approves. °· Every day, check for signs of infection where a catheter was inserted at your neck or groin (insertion site). °This information is not intended to replace advice given to you by your health care provider. Make sure you discuss any questions you have with your health care provider. °Document Released: 07/16/2013 Document   Revised: 08/16/2016 Document Reviewed: 08/16/2016 °Elsevier Interactive Patient Education © 2017 [employer] ° °

## 2017-11-06 NOTE — Progress Notes (Signed)
Patient ID: [name], female   DOB: [birth year], 42 y.o.   MRN: [id number]  Chief Complaint  Patient presents with  . New Patient (Initial Visit)    Discuss IVC filter    HPI [name] is a 42 y.o. female.  I am asked to see the patient by Dr. Dareen Piano and Dr. Allena Katz for evaluation of IVC filter placement with her upcoming knee surgery.  The patient reports a couple of weeks ago injuring her knee.  She tore her meniscus and this became very swollen and painful.  The swelling and pain has persisted in both an MRI and an ultrasound subsequently showed DVT in the left gastrocnemius vein encroaching into the left popliteal vein.  She was started on Xarelto which she has tolerated well.  She had no previous history of DVT or superficial thrombophlebitis prior to this issue.  This did happen after the trauma.  No right leg symptoms.  She needs knee surgery and will have to have her Xarelto stopped around the time of surgery, and for this reason we are asked to place an IVC filter.  She has no current chest pain or shortness of breath.  No fever or chills.   Past Medical History:  Diagnosis Date  . Asthma   . Diabetes mellitus without complication (HCC)   . Hypertension   . Hypothyroidism      Family History  Problem Relation Age of Onset  . Diabetes Mother   . Varicose Veins Mother   . Heart disease Father   . Diabetes Father   . Hypertension Father   . Cancer Sister     Social History Social History   Tobacco Use  . Smoking status: Never Smoker  . Smokeless tobacco: Never Used  Substance Use Topics  . Alcohol use: No    Frequency: Never  . Drug use: Not on file  Works as a Engineer, civil (consulting)  No Known Allergies  Current Outpatient Medications  Medication Sig Dispense Refill  . aspirin EC 81 MG tablet Take by mouth.    . budesonide-formoterol (SYMBICORT) 80-4.5 MCG/ACT inhaler INHALE TWO PUFFS BY MOUTH TWICE DAILY    . Cholecalciferol (VITAMIN D3) 2000 units capsule Take  by mouth.    . levalbuterol (XOPENEX HFA) 45 MCG/ACT inhaler INHALE TWO PUFFS INTO LUNGS AS NEEDED AS DIRECTED    . levothyroxine (SYNTHROID, LEVOTHROID) 88 MCG tablet Take by mouth.    . liothyronine (CYTOMEL) 5 MCG tablet Take by mouth.    . losartan (COZAAR) 50 MG tablet 1 tablet daily. NEEDS APPOINTMENT FOR FURTHER REFILLS    . Rivaroxaban (XARELTO STARTER PACK) 15 & 20 MG TBPK Take by mouth.    . traMADol (ULTRAM) 50 MG tablet Take by mouth.     No current facility-administered medications for this visit.       REVIEW OF SYSTEMS (Negative unless checked)  Constitutional: [] Weight loss  [] Fever  [] Chills Cardiac: [] Chest pain   [] Chest pressure   [] Palpitations   [] Shortness of breath when laying flat   [] Shortness of breath at rest   [] Shortness of breath with exertion. Vascular:  [x] Pain in legs with walking   [x] Pain in legs at rest   [x] Pain in legs when laying flat   [] Claudication   [] Pain in feet when walking  [] Pain in feet at rest  [] Pain in feet when laying flat   [x] History of DVT   [] Phlebitis   [x] Swelling in legs   [] Varicose veins   []   Non-healing ulcers Pulmonary:   [] Uses home oxygen   [] Productive cough   [] Hemoptysis   [] Wheeze  [] COPD   [] Asthma Neurologic:  [] Dizziness  [] Blackouts   [] Seizures   [] History of stroke   [] History of TIA  [] Aphasia   [] Temporary blindness   [] Dysphagia   [] Weakness or numbness in arms   [] Weakness or numbness in legs Musculoskeletal:  [] Arthritis   [] Joint swelling   [] Joint pain   [] Low back pain Hematologic:  [] Easy bruising  [] Easy bleeding   [] Hypercoagulable state   [] Anemic  [] Hepatitis Gastrointestinal:  [] Blood in stool   [] Vomiting blood  [] Gastroesophageal reflux/heartburn   [] Abdominal pain Genitourinary:  [] Chronic kidney disease   [] Difficult urination  [] Frequent urination  [] Burning with urination   [] Hematuria Skin:  [] Rashes   [] Ulcers   [] Wounds Psychological:  [] History of anxiety   []  History of major  depression.    Physical Exam BP (!) 160/91 (BP Location: Right Arm)   Pulse 91   Resp 16   Ht 5\' 2"  (1.575 m)   Wt 107.5 kg (237 lb)   BMI 43.35 kg/m  Gen:  WD/WN, NAD Head: [place]/AT, No temporalis wasting.  Ear/Nose/Throat: Hearing grossly intact, nares w/o erythema or drainage, oropharynx w/o Erythema/Exudate Eyes: Conjunctiva clear, sclera non-icteric  Neck: trachea midline.  No JVD.  Pulmonary:  Good air movement, respirations not labored, no use of accessory muscles Cardiac: RRR, no JVD Vascular:  Vessel Right Left  Radial Palpable Palpable                                    Musculoskeletal: M/S 5/5 throughout.  Extremities without ischemic changes.  No deformity or atrophy.  Mild left lower extremity edema. Neurologic: Sensation grossly intact in extremities.  Symmetrical.  Speech is fluent. Motor exam as listed above. Psychiatric: Judgment intact, Mood & affect appropriate for pt's clinical situation. Dermatologic: No rashes or ulcers noted.  No cellulitis or open wounds.  Radiology Mr Knee Left Wo Contrast  Result Date: 10/26/2017 CLINICAL DATA:  Left knee pain and swelling with painful range of motion. EXAM: MRI OF THE LEFT KNEE WITHOUT CONTRAST TECHNIQUE: Multiplanar, multisequence MR imaging of the knee was performed. No intravenous contrast was administered. COMPARISON:  None. FINDINGS: MENISCI Medial meniscus: There is an incomplete radial tear of the root of the posterior horn with slight peripheral extrusion of the meniscus. Lateral meniscus:  Normal. LIGAMENTS Cruciates:  Normal. Collaterals:  Normal. CARTILAGE Patellofemoral:  Normal. Medial:  Normal. Lateral:  Normal. Joint: Moderate joint effusion. No plica. Hoffa's fat pad is normal. Popliteal Fossa: There is abnormal edema in the proximal medial head of the gastrocnemius with what appears to be intramuscular venous thrombosis in the medial head of the gastrocnemius. No Baker's cyst. Extensor Mechanism:   Normal. Bones:  No significant abnormalities. Other: None IMPRESSION: 1. Abnormal edema and probable intramuscular venous thrombosis in the medial head of the gastrocnemius. 2. Incomplete radial tear of the root of the posterior horn of the medial meniscus. 3. Moderate joint effusion. Electronically Signed   By: Francene BoyersJames  Maxwell M.D.   On: 10/26/2017 10:33   [place] Venous Img Lower Unilateral Left  Result Date: 10/30/2017 CLINICAL DATA:  Left leg pain EXAM: LEFT LOWER EXTREMITY VENOUS DOPPLER ULTRASOUND TECHNIQUE: Gray-scale sonography with graded compression, as well as color Doppler and duplex ultrasound were performed to evaluate the lower extremity deep venous systems from the level  of the common femoral vein and including the common femoral, femoral, profunda femoral, popliteal and calf veins including the posterior tibial, peroneal and gastrocnemius veins when visible. The superficial great saphenous vein was also interrogated. Spectral Doppler was utilized to evaluate flow at rest and with distal augmentation maneuvers in the common femoral, femoral and popliteal veins. COMPARISON:  None. FINDINGS: Contralateral Common Femoral Vein: Respiratory phasicity is normal and symmetric with the symptomatic side. No evidence of thrombus. Normal compressibility. Common Femoral Vein: No evidence of thrombus. Normal compressibility, respiratory phasicity and response to augmentation. Saphenofemoral Junction: No evidence of thrombus. Normal compressibility and flow on color Doppler imaging. Profunda Femoral Vein: No evidence of thrombus. Normal compressibility and flow on color Doppler imaging. Femoral Vein: No evidence of thrombus. Normal compressibility, respiratory phasicity and response to augmentation. Popliteal Vein: Echogenic noncompressible material is noted within the popliteal vein extending from the gastrocnemius veins similar to that seen on recent MRI examination. Degree of popliteal thrombus has progressed  somewhat in the interval from the prior exam Calf Veins: No evidence of thrombus. Normal compressibility and flow on color Doppler imaging. Superficial Great Saphenous Vein: No evidence of thrombus. Normal compressibility. Venous Reflux:  None. Other Findings:  None. IMPRESSION: Thrombus extending from the veins of the medial head of the gastrocnemius similar to that seen on MRI propagating into the left popliteal vein. Electronically Signed   By: Alcide Clever M.D.   On: 10/30/2017 11:06    Labs No results found for this or any previous visit (from the past 2160 hour(s)).  Assessment/Plan:  Hypertension blood pressure control important in reducing the progression of atherosclerotic disease. On appropriate oral medications.   Derangement of posterior horn of medial meniscus due to old tear or injury, left knee Needs to have surgery and has an active DVT which puts her at high risk for thromboembolic complications.  DVT of lower limb, acute (HCC) The patient has an acute DVT of the left lower extremity in association with a meniscus tear.  She was started on anticoagulation appropriately and will need to do this for at least 3 months.  Given her need for upcoming surgery, her anticoagulation will have to have temporary cessation and she will be at increased risk of thromboembolic complications.  For this reason, she should have an IVC filter placed and then subsequently removed 6-12 weeks after her surgery.  I generally would do this a day or 2 before her procedure but she is adamant that this be done while she is under general anesthesia due to her anxiety.  I have tried to calm her anxiety but she is also a work and her surgery is scheduled for Monday.  If it is okay with the orthopedic surgeon, I will place the IVC filter at the beginning of the procedure in the operating room with a C arm.  Risks and benefits were discussed and she is agreeable with our plan of care.      Festus Barren 11/06/2017, 12:03 PM   This note was created with Dragon medical transcription system.  Any errors from dictation are unintentional.

## 2017-11-06 NOTE — Assessment & Plan Note (Signed)
The patient has an acute DVT of the left lower extremity in association with a meniscus tear.  She was started on anticoagulation appropriately and will need to do this for at least 3 months.  Given her need for upcoming surgery, her anticoagulation will have to have temporary cessation and she will be at increased risk of thromboembolic complications.  For this reason, she should have an IVC filter placed and then subsequently removed 6-12 weeks after her surgery.  I generally would do this a day or 2 before her procedure but she is adamant that this be done while she is under general anesthesia due to her anxiety.  I have tried to calm her anxiety but she is also a work and her surgery is scheduled for Monday.  If it is okay with the orthopedic surgeon, I will place the IVC filter at the beginning of the procedure in the operating room with a C arm.  Risks and benefits were discussed and she is agreeable with our plan of care.

## 2017-11-08 ENCOUNTER — Inpatient Hospital Stay: Admission: RE | Admit: 2017-11-08 | Payer: Commercial Managed Care - PPO | Source: Ambulatory Visit

## 2017-11-08 ENCOUNTER — Encounter
Admission: RE | Admit: 2017-11-08 | Discharge: 2017-11-08 | Disposition: A | Discharge status: Home / Self Care | Payer: Commercial Managed Care - PPO | Source: Ambulatory Visit | Attending: Orthopedic Surgery | Admitting: Orthopedic Surgery

## 2017-11-08 ENCOUNTER — Other Ambulatory Visit (INDEPENDENT_AMBULATORY_CARE_PROVIDER_SITE_OTHER): Payer: Self-pay | Admitting: Vascular Surgery

## 2017-11-08 ENCOUNTER — Other Ambulatory Visit: Payer: Self-pay

## 2017-11-08 DIAGNOSIS — I10 Essential (primary) hypertension: Secondary | ICD-10-CM | POA: Diagnosis not present

## 2017-11-08 DIAGNOSIS — I517 Cardiomegaly: Secondary | ICD-10-CM | POA: Diagnosis not present

## 2017-11-08 DIAGNOSIS — I452 Bifascicular block: Secondary | ICD-10-CM | POA: Insufficient documentation

## 2017-11-08 DIAGNOSIS — R9431 Abnormal electrocardiogram [ECG] [EKG]: Secondary | ICD-10-CM | POA: Diagnosis not present

## 2017-11-08 DIAGNOSIS — Z01812 Encounter for preprocedural laboratory examination: Secondary | ICD-10-CM | POA: Insufficient documentation

## 2017-11-08 DIAGNOSIS — Z01810 Encounter for preprocedural cardiovascular examination: Secondary | ICD-10-CM | POA: Diagnosis present

## 2017-11-08 HISTORY — DX: Cardiac murmur, unspecified: R01.1

## 2017-11-08 HISTORY — DX: Acute embolism and thrombosis of unspecified deep veins of unspecified lower extremity: I82.409

## 2017-11-08 LAB — CBC
HEMATOCRIT: 37.6 % (ref 35.0–47.0)
Hemoglobin: 12 g/dL (ref 12.0–16.0)
MCH: 27.1 pg (ref 26.0–34.0)
MCHC: 32 g/dL (ref 32.0–36.0)
MCV: 84.6 fL (ref 80.0–100.0)
Platelets: 191 10*3/uL (ref 150–440)
RBC: 4.44 MIL/uL (ref 3.80–5.20)
RDW: 15.2 % — AB (ref 11.5–14.5)
WBC: 4.2 10*3/uL (ref 3.6–11.0)

## 2017-11-08 LAB — BASIC METABOLIC PANEL
ANION GAP: 8 (ref 5–15)
BUN: 12 mg/dL (ref 6–20)
CALCIUM: 8.9 mg/dL (ref 8.9–10.3)
CO2: 24 mmol/L (ref 22–32)
Chloride: 104 mmol/L (ref 101–111)
Creatinine, Ser: 0.66 mg/dL (ref 0.44–1.00)
GFR calc Af Amer: >60 mL/min (ref >60)
GFR calc non Af Amer: >60 mL/min (ref >60)
GLUCOSE: 156 mg/dL — AB (ref 65–99)
Potassium: 3.9 mmol/L (ref 3.5–5.1)
Sodium: 136 mmol/L (ref 135–145)

## 2017-11-08 NOTE — Patient Instructions (Signed)
Your procedure is scheduled on: Monday , February 4TH  Report to THE [facility],2ND FLOOR  To find out your arrival time please call [phone] between 1PM - 3PM ON Friday, February 1   Remember: Instructions that are not followed completely may result in serious medical risk, up to and including death, or upon the discretion of your surgeon and anesthesiologist your surgery may need to be rescheduled.     _X__ 1. Do not eat food after midnight the night before your procedure.                 No gum chewing or hard candies.                   You may drink clear liquids up to 2 hours before you are scheduled                        to arrive for your surgery-                   Clear Liquids include:  water, apple juice without pulp, clear carbohydrate                 drink such as Clearfast of Gartorade, Black Coffee or Tea (Do not add                 anything to coffee or tea).  __X__2.  On the morning of surgery brush your teeth with toothpaste and water,                       you may rinse your mouth with mouthwash if you wish.                                  Do not swallow any toothpaste of mouthwash.     _X__ 3.  No Alcohol for 24 hours before or after surgery.   _X__ 4.  Do Not Smoke or use e-cigarettes For 24 Hours Prior to Your Surgery.                 Do not use any chewable tobacco products for at least 6 hours prior to                 surgery.  ____  5.  Bring all medications with you on the day of surgery if instructed.   __X__  6.  Notify your doctor if there is any change in your medical condition      (cold, fever, infections).     Do not wear jewelry, make-up, hairpins, clips or nail polish. Do not wear lotions, powders, or perfumes. You may wear deodorant. Do not shave 48 hours prior to surgery. Men may shave face and neck. Do not bring valuables to the hospital.    [facility] is not responsible for any belongings or  valuables.  Contacts, dentures or bridgework may not be worn into surgery. Leave your suitcase in the car. After surgery it may be brought to your room. For patients admitted to the hospital, discharge time is determined by your treatment team.   Patients discharged the day of surgery will not be allowed to drive home.   Please read over the following fact sheets that you were given:   PREPARING FOR SURGERY    ____ Take these medicines the  morning of surgery with A SIP OF WATER:    1. ZANTAC (TAKE THE NIGHT BEFORE AND THE MORNING OF SURGERY)  2. SYMBICORT INHALER  3. SYNTHROID  4. CYTOMEL  5. ASPIRIN  6.  ____ Fleet Enema (as directed)   _X___ Use CHG Soap as directed  __X__ Use inhalers on the day of surgery  _X___ DO NOT STOP ASPIRIN.  TAKE ON MORNING OF SURGERY, UNLESS TOLD OTHERWISE  ____ Stop Anti-inflammatories AS OF TODAY.   ____ Stop supplements until after surgery.    ____ Bring C-Pap to the hospital.   CONTINUE VITAMIN D3, XARELTO AND LOSARTAN UP UNTIL THE DAY BEFORE              SURGERY BUT DO NOT TAKE ON THE DAY OF SURGERY  WEAR LOOSE CLOTHING TO BE ABLE TO GET A BRACE OVER YOUR LEG.  BEGIN STOOL SOFTENERS AS SOON AS DISCHARGED AND TAKING NARCOTICS.

## 2017-11-12 ENCOUNTER — Ambulatory Visit: Payer: Commercial Managed Care - PPO

## 2017-11-12 ENCOUNTER — Ambulatory Visit
Admission: RE | Admit: 2017-11-12 | Discharge: 2017-11-12 | Disposition: A | Discharge status: Home / Self Care | Payer: Commercial Managed Care - PPO | Source: Ambulatory Visit | Attending: Orthopedic Surgery | Admitting: Orthopedic Surgery

## 2017-11-12 ENCOUNTER — Ambulatory Visit: Payer: Commercial Managed Care - PPO | Admitting: Certified Registered Nurse Anesthetist

## 2017-11-12 ENCOUNTER — Encounter
Admission: RE | Disposition: A | Discharge status: Home / Self Care | Payer: Self-pay | Source: Ambulatory Visit | Attending: Orthopedic Surgery

## 2017-11-12 ENCOUNTER — Encounter: Payer: Self-pay | Admitting: Certified Registered Nurse Anesthetist

## 2017-11-12 ENCOUNTER — Other Ambulatory Visit: Payer: Self-pay

## 2017-11-12 DIAGNOSIS — Z7982 Long term (current) use of aspirin: Secondary | ICD-10-CM | POA: Insufficient documentation

## 2017-11-12 DIAGNOSIS — I82432 Acute embolism and thrombosis of left popliteal vein: Secondary | ICD-10-CM | POA: Insufficient documentation

## 2017-11-12 DIAGNOSIS — I82409 Acute embolism and thrombosis of unspecified deep veins of unspecified lower extremity: Secondary | ICD-10-CM | POA: Diagnosis not present

## 2017-11-12 DIAGNOSIS — Z79899 Other long term (current) drug therapy: Secondary | ICD-10-CM | POA: Insufficient documentation

## 2017-11-12 DIAGNOSIS — I10 Essential (primary) hypertension: Secondary | ICD-10-CM | POA: Diagnosis not present

## 2017-11-12 DIAGNOSIS — M1712 Unilateral primary osteoarthritis, left knee: Secondary | ICD-10-CM | POA: Insufficient documentation

## 2017-11-12 DIAGNOSIS — M23222 Derangement of posterior horn of medial meniscus due to old tear or injury, left knee: Secondary | ICD-10-CM | POA: Diagnosis present

## 2017-11-12 DIAGNOSIS — Z6841 Body Mass Index (BMI) 40.0 and over, adult: Secondary | ICD-10-CM | POA: Insufficient documentation

## 2017-11-12 DIAGNOSIS — E7800 Pure hypercholesterolemia, unspecified: Secondary | ICD-10-CM | POA: Insufficient documentation

## 2017-11-12 DIAGNOSIS — E119 Type 2 diabetes mellitus without complications: Secondary | ICD-10-CM | POA: Diagnosis not present

## 2017-11-12 DIAGNOSIS — E039 Hypothyroidism, unspecified: Secondary | ICD-10-CM | POA: Insufficient documentation

## 2017-11-12 HISTORY — PX: VENA CAVA FILTER PLACEMENT: SHX1085

## 2017-11-12 HISTORY — PX: KNEE ARTHROSCOPY WITH MENISCAL REPAIR: SHX5653

## 2017-11-12 LAB — GLUCOSE, CAPILLARY
GLUCOSE-CAPILLARY: 194 mg/dL — AB (ref 65–99)
Glucose-Capillary: 130 mg/dL — ABNORMAL HIGH (ref 65–99)

## 2017-11-12 SURGERY — ARTHROSCOPY, KNEE, WITH MENISCUS REPAIR
Anesthesia: General | Site: Knee | Laterality: Right | Wound class: Clean

## 2017-11-12 MED ORDER — ONDANSETRON 4 MG PO TBDP: 4.0000 mg | ORAL_TABLET | Freq: Three times a day (TID) | ORAL | 0 refills | Status: DC | PRN

## 2017-11-12 MED ORDER — SODIUM CHLORIDE FLUSH 0.9 % IV SOLN
INTRAVENOUS | Status: AC
  Filled 2017-11-12: qty 10

## 2017-11-12 MED ORDER — BUPIVACAINE HCL (PF) 0.5 % IJ SOLN
INTRAMUSCULAR | Status: DC | PRN
  Administered 2017-11-12: 5 mL

## 2017-11-12 MED ORDER — OXYCODONE HCL 5 MG PO TABS: 5.0000 mg | ORAL_TABLET | ORAL | 0 refills | Status: AC | PRN

## 2017-11-12 MED ORDER — BUPIVACAINE HCL (PF) 0.5 % IJ SOLN
INTRAMUSCULAR | Status: AC
  Filled 2017-11-12: qty 30

## 2017-11-12 MED ORDER — SUGAMMADEX SODIUM 200 MG/2ML IV SOLN
INTRAVENOUS | Status: DC | PRN
  Administered 2017-11-12: 200 mg via INTRAVENOUS

## 2017-11-12 MED ORDER — PROMETHAZINE HCL 25 MG/ML IJ SOLN
INTRAMUSCULAR | Status: AC
  Administered 2017-11-12: 6.25 mg via INTRAVENOUS
  Filled 2017-11-12: qty 1

## 2017-11-12 MED ORDER — SODIUM CHLORIDE 0.9 % IV SOLN
INTRAVENOUS | Status: DC
  Administered 2017-11-12 (×2): via INTRAVENOUS

## 2017-11-12 MED ORDER — LIDOCAINE HCL (CARDIAC) 20 MG/ML IV SOLN
INTRAVENOUS | Status: DC | PRN
  Administered 2017-11-12: 20 mg via INTRAVENOUS
  Administered 2017-11-12: 80 mg via INTRAVENOUS

## 2017-11-12 MED ORDER — FENTANYL CITRATE (PF) 100 MCG/2ML IJ SOLN
INTRAMUSCULAR | Status: AC
  Filled 2017-11-12: qty 2

## 2017-11-12 MED ORDER — LIDOCAINE HCL (PF) 2 % IJ SOLN
INTRAMUSCULAR | Status: AC
  Filled 2017-11-12: qty 10

## 2017-11-12 MED ORDER — PROMETHAZINE HCL 25 MG/ML IJ SOLN
6.2500 mg | INTRAMUSCULAR | Status: AC | PRN
  Administered 2017-11-12 (×2): 6.25 mg via INTRAVENOUS

## 2017-11-12 MED ORDER — DIPHENHYDRAMINE HCL 50 MG/ML IJ SOLN: 25.0000 mg | Freq: Once | INTRAMUSCULAR | Status: DC

## 2017-11-12 MED ORDER — PROPOFOL 10 MG/ML IV BOLUS
INTRAVENOUS | Status: DC | PRN
  Administered 2017-11-12: 180 mg via INTRAVENOUS
  Administered 2017-11-12: 200 mg via INTRAVENOUS

## 2017-11-12 MED ORDER — METHYLPREDNISOLONE SODIUM SUCC 125 MG IJ SOLR: 125.0000 mg | Freq: Once | INTRAMUSCULAR | Status: DC

## 2017-11-12 MED ORDER — ACETAMINOPHEN 500 MG PO TABS: 1000.0000 mg | ORAL_TABLET | Freq: Three times a day (TID) | ORAL | 2 refills | Status: AC

## 2017-11-12 MED ORDER — DEXAMETHASONE SODIUM PHOSPHATE 10 MG/ML IJ SOLN
INTRAMUSCULAR | Status: AC
  Filled 2017-11-12: qty 1

## 2017-11-12 MED ORDER — CEFAZOLIN SODIUM-DEXTROSE 2-4 GM/100ML-% IV SOLN
2.0000 g | Freq: Once | INTRAVENOUS | Status: AC
  Administered 2017-11-12: 2 g via INTRAVENOUS

## 2017-11-12 MED ORDER — CEFAZOLIN SODIUM-DEXTROSE 2-4 GM/100ML-% IV SOLN
INTRAVENOUS | Status: AC
  Filled 2017-11-12: qty 100

## 2017-11-12 MED ORDER — PROMETHAZINE HCL 25 MG/ML IJ SOLN
12.5000 mg | Freq: Once | INTRAMUSCULAR | Status: AC
  Administered 2017-11-12: 12.5 mg via INTRAVENOUS

## 2017-11-12 MED ORDER — MIDAZOLAM HCL 2 MG/2ML IJ SOLN
INTRAMUSCULAR | Status: AC
  Filled 2017-11-12: qty 2

## 2017-11-12 MED ORDER — SUCCINYLCHOLINE CHLORIDE 20 MG/ML IJ SOLN
INTRAMUSCULAR | Status: AC
  Filled 2017-11-12: qty 1

## 2017-11-12 MED ORDER — PROPOFOL 10 MG/ML IV BOLUS
INTRAVENOUS | Status: AC
  Filled 2017-11-12: qty 20

## 2017-11-12 MED ORDER — ROCURONIUM BROMIDE 50 MG/5ML IV SOLN
INTRAVENOUS | Status: AC
  Filled 2017-11-12: qty 1

## 2017-11-12 MED ORDER — ROCURONIUM BROMIDE 100 MG/10ML IV SOLN
INTRAVENOUS | Status: DC | PRN
  Administered 2017-11-12: 5 mg via INTRAVENOUS
  Administered 2017-11-12: 45 mg via INTRAVENOUS

## 2017-11-12 MED ORDER — OXYCODONE HCL 5 MG PO TABS
5.0000 mg | ORAL_TABLET | Freq: Once | ORAL | Status: AC
  Administered 2017-11-12: 5 mg via ORAL
  Filled 2017-11-12: qty 1

## 2017-11-12 MED ORDER — SUGAMMADEX SODIUM 200 MG/2ML IV SOLN
INTRAVENOUS | Status: AC
  Filled 2017-11-12: qty 2

## 2017-11-12 MED ORDER — FENTANYL CITRATE (PF) 100 MCG/2ML IJ SOLN
INTRAMUSCULAR | Status: DC | PRN
  Administered 2017-11-12: 50 ug via INTRAVENOUS
  Administered 2017-11-12: 100 ug via INTRAVENOUS
  Administered 2017-11-12 (×4): 50 ug via INTRAVENOUS
  Administered 2017-11-12: 100 ug via INTRAVENOUS

## 2017-11-12 MED ORDER — SUCCINYLCHOLINE CHLORIDE 20 MG/ML IJ SOLN
INTRAMUSCULAR | Status: DC | PRN
  Administered 2017-11-12: 100 mg via INTRAVENOUS

## 2017-11-12 MED ORDER — IBUPROFEN 800 MG PO TABS: 800.0000 mg | ORAL_TABLET | Freq: Three times a day (TID) | ORAL | 0 refills | Status: AC

## 2017-11-12 MED ORDER — ONDANSETRON HCL 4 MG/2ML IJ SOLN
INTRAMUSCULAR | Status: AC
  Filled 2017-11-12: qty 2

## 2017-11-12 MED ORDER — DIPHENHYDRAMINE HCL 50 MG/ML IJ SOLN
25.0000 mg | Freq: Once | INTRAMUSCULAR | Status: AC
  Administered 2017-11-12: 25 mg via INTRAVENOUS

## 2017-11-12 MED ORDER — DIPHENHYDRAMINE HCL 50 MG/ML IJ SOLN
INTRAMUSCULAR | Status: AC
  Filled 2017-11-12: qty 1

## 2017-11-12 MED ORDER — LIDOCAINE-EPINEPHRINE 1 %-1:100000 IJ SOLN
INTRAMUSCULAR | Status: AC
  Filled 2017-11-12: qty 1

## 2017-11-12 MED ORDER — METHYLPREDNISOLONE SODIUM SUCC 125 MG IJ SOLR
125.0000 mg | Freq: Once | INTRAMUSCULAR | Status: AC
  Administered 2017-11-12: 125 mg via INTRAVENOUS

## 2017-11-12 MED ORDER — LIDOCAINE-EPINEPHRINE 1 %-1:100000 IJ SOLN
INTRAMUSCULAR | Status: DC | PRN
  Administered 2017-11-12: 5 mL

## 2017-11-12 MED ORDER — CEFAZOLIN SODIUM-DEXTROSE 2-4 GM/100ML-% IV SOLN: 2.0000 g | Freq: Once | INTRAVENOUS | Status: DC

## 2017-11-12 MED ORDER — FENTANYL CITRATE (PF) 100 MCG/2ML IJ SOLN
25.0000 ug | INTRAMUSCULAR | Status: DC | PRN
  Administered 2017-11-12 (×2): 50 ug via INTRAVENOUS

## 2017-11-12 MED ORDER — DEXAMETHASONE SODIUM PHOSPHATE 10 MG/ML IJ SOLN
INTRAMUSCULAR | Status: DC | PRN
  Administered 2017-11-12: 10 mg via INTRAVENOUS

## 2017-11-12 MED ORDER — OXYCODONE HCL 5 MG PO TABS
ORAL_TABLET | ORAL | Status: AC
  Filled 2017-11-12: qty 1

## 2017-11-12 MED ORDER — METHYLPREDNISOLONE SODIUM SUCC 125 MG IJ SOLR
INTRAMUSCULAR | Status: AC
  Filled 2017-11-12: qty 2

## 2017-11-12 MED ORDER — MIDAZOLAM HCL 2 MG/2ML IJ SOLN
INTRAMUSCULAR | Status: DC | PRN
  Administered 2017-11-12: 2 mg via INTRAVENOUS

## 2017-11-12 MED ORDER — ONDANSETRON HCL 4 MG/2ML IJ SOLN
INTRAMUSCULAR | Status: DC | PRN
  Administered 2017-11-12: 4 mg via INTRAVENOUS

## 2017-11-12 MED ORDER — FENTANYL CITRATE (PF) 100 MCG/2ML IJ SOLN
INTRAMUSCULAR | Status: AC
  Administered 2017-11-12: 50 ug via INTRAVENOUS
  Filled 2017-11-12: qty 2

## 2017-11-12 MED ORDER — FENTANYL CITRATE (PF) 250 MCG/5ML IJ SOLN
INTRAMUSCULAR | Status: AC
  Filled 2017-11-12: qty 5

## 2017-11-12 SURGICAL SUPPLY — 72 items
ADAPTER IRRIG TUBE 2 SPIKE SOL (ADAPTER) ×3 IMPLANT
ADPR TBG 2 SPK PMP STRL ASCP (ADAPTER) ×2
ANCH SUT SWLK 19.1X5.5 CLS (Anchor) ×2 IMPLANT
ANCHOR SWIVELOCK BIO COMP (Anchor) ×1 IMPLANT
BLADE CLIPPER SURG (BLADE) ×1 IMPLANT
BLADE SURG SZ10 CARB STEEL (BLADE) ×3 IMPLANT
BLADE SURG SZ11 CARB STEEL (BLADE) ×3 IMPLANT
BNDG ESMARK 6X12 TAN STRL LF (GAUZE/BANDAGES/DRESSINGS) IMPLANT
BRUSH SCRUB EZ  4% CHG (MISCELLANEOUS) ×1
BRUSH SCRUB EZ 4% CHG (MISCELLANEOUS) ×2 IMPLANT
BUR RADIUS 3.5 (BURR) ×3 IMPLANT
BUR RADIUS 4.0X18.5 (BURR) ×3 IMPLANT
CARTRIDGE SUT 2-0 NONSTITCH (Anchor) ×1 IMPLANT
CHLORAPREP W/TINT 26ML (MISCELLANEOUS) ×5 IMPLANT
COOLER POLAR GLACIER W/PUMP (MISCELLANEOUS) ×3 IMPLANT
CUFF TOURN 24 STER (MISCELLANEOUS) IMPLANT
CUFF TOURN 30 STER DUAL PORT (MISCELLANEOUS) ×1 IMPLANT
DRAPE IMP U-DRAPE 54X76 (DRAPES) ×3 IMPLANT
DRAPE LEGGINS SURG 28X43 STRL (DRAPES) ×3 IMPLANT
DRAPE U-SHAPE 47X51 STRL (DRAPES) ×1 IMPLANT
ELECT REM PT RETURN 9FT ADLT (ELECTROSURGICAL) ×3
ELECTRODE REM PT RTRN 9FT ADLT (ELECTROSURGICAL) ×2 IMPLANT
FILTER VC CELECT-FEMORAL (Filter) ×1 IMPLANT
GAUZE SPONGE 4X4 12PLY STRL (GAUZE/BANDAGES/DRESSINGS) ×3 IMPLANT
GLOVE BIOGEL PI IND STRL 7.0 (GLOVE) IMPLANT
GLOVE BIOGEL PI IND STRL 8 (GLOVE) ×2 IMPLANT
GLOVE BIOGEL PI INDICATOR 7.0 (GLOVE) ×2
GLOVE BIOGEL PI INDICATOR 8 (GLOVE) ×3
GLOVE PROTEXIS LATEX SZ 7.5 (GLOVE) ×6 IMPLANT
GLOVE SURG LATEX 7.5 PF (GLOVE) IMPLANT
GLOVE SURG SYN 7.5  E (GLOVE) ×3
GLOVE SURG SYN 7.5 E (GLOVE) ×6 IMPLANT
GLOVE SURG SYN 7.5 PF PI (GLOVE) ×2 IMPLANT
GOWN STRL REUS W/ TWL LRG LVL3 (GOWN DISPOSABLE) ×2 IMPLANT
GOWN STRL REUS W/ TWL XL LVL3 (GOWN DISPOSABLE) ×2 IMPLANT
GOWN STRL REUS W/TWL LRG LVL3 (GOWN DISPOSABLE) ×3
GOWN STRL REUS W/TWL XL LVL3 (GOWN DISPOSABLE) ×9
IMPL SYS MENISCAL ROOT REPAIR (Orthopedic Implant) ×1 IMPLANT
IV LACTATED RINGER IRRG 3000ML (IV SOLUTION) ×30
IV LR IRRIG 3000ML ARTHROMATIC (IV SOLUTION) ×8 IMPLANT
KIT TURNOVER KIT A (KITS) ×3 IMPLANT
MANIFOLD NEPTUNE II (INSTRUMENTS) ×3 IMPLANT
MAT BLUE FLOOR 46X72 FLO (MISCELLANEOUS) ×5 IMPLANT
NDL MAYO CATGUT SZ5 (NEEDLE) ×3
NDL SUT 5 .5 CRC TPR PNT MAYO (NEEDLE) IMPLANT
NEEDLE HYPO 22GX1.5 SAFETY (NEEDLE) ×3 IMPLANT
NOVOSTICH PRO MENISCAL 2-0 (Miscellaneous) ×3 IMPLANT
PACK ANGIOGRAPHY (CUSTOM PROCEDURE TRAY) ×1 IMPLANT
PACK ARTHROSCOPY KNEE (MISCELLANEOUS) ×3 IMPLANT
PAD ABD DERMACEA PRESS 5X9 (GAUZE/BANDAGES/DRESSINGS) ×6 IMPLANT
PAD WRAPON POLAR KNEE (MISCELLANEOUS) ×2 IMPLANT
PADDING CAST 6X4YD NS (MISCELLANEOUS) ×1
PADDING CAST COTTON 6X4 NS (MISCELLANEOUS) ×2 IMPLANT
PENCIL ELECTRO HAND CTR (MISCELLANEOUS) ×3 IMPLANT
SET TUBE SUCT SHAVER OUTFL 24K (TUBING) ×3 IMPLANT
SET TUBE TIP INTRA-ARTICULAR (MISCELLANEOUS) ×3 IMPLANT
STRIP CLOSURE SKIN 1/2X4 (GAUZE/BANDAGES/DRESSINGS) ×3 IMPLANT
SUT ETHILON 3-0 FS-10 30 BLK (SUTURE) ×3
SUT MNCRL AB 4-0 PS2 18 (SUTURE) ×1 IMPLANT
SUT VIC AB 0 CT1 36 (SUTURE) ×1 IMPLANT
SUT VIC AB 2-0 CT1 27 (SUTURE) ×3
SUT VIC AB 2-0 CT1 TAPERPNT 27 (SUTURE) IMPLANT
SUTURE EHLN 3-0 FS-10 30 BLK (SUTURE) ×2 IMPLANT
SYSTEM IMPL ACL/PCL SWIVILLOCK (Anchor) ×1 IMPLANT
SYSTEM NVSTCH PRO MENISCAL 2-0 (Miscellaneous) IMPLANT
TAPE TRANSPORE STRL 2 31045 (GAUZE/BANDAGES/DRESSINGS) ×3 IMPLANT
TOWEL OR 17X26 4PK STRL BLUE (TOWEL DISPOSABLE) ×6 IMPLANT
TUBING ARTHRO INFLOW-ONLY STRL (TUBING) ×3 IMPLANT
WAND HAND CNTRL MULTIVAC 50 (MISCELLANEOUS) IMPLANT
WAND HAND CNTRL MULTIVAC 90 (MISCELLANEOUS) IMPLANT
WIRE J 3MM .035X145CM (WIRE) ×1 IMPLANT
WRAPON POLAR PAD KNEE (MISCELLANEOUS) ×3

## 2017-11-12 NOTE — H&P (Signed)
Paper H&P to be scanned into permanent record. H&P reviewed. No significant changes noted.  

## 2017-11-12 NOTE — Transfer of Care (Signed)
Immediate Anesthesia Transfer of Care Note  Patient: [name]  Procedure(s) Performed: KNEE ARTHROSCOPY WITH MENISCAL REPAIR- ROOT REPAIR AND CHONDROPLASTY (Left Knee) INSERTION VENA-CAVA FILTER (Right Groin)  Patient Location: PACU  Anesthesia Type:General  Level of Consciousness: awake, alert , oriented and patient cooperative  Airway & Oxygen Therapy: Patient Spontanous Breathing and Patient connected to face mask oxygen  Post-op Assessment: Report given to RN and Post -op Vital signs reviewed and stable  Post vital signs: Reviewed and stable  Last Vitals:  Vitals:   11/12/17 0741  BP: (!) 149/83  Pulse: 89  Resp: 18  Temp: 36.4 C  SpO2: 100%    Last Pain:  Vitals:   11/12/17 0741  TempSrc: Tympanic  PainSc: 2          Complications: No apparent anesthesia complications

## 2017-11-12 NOTE — Anesthesia Post-op Follow-up Note (Signed)
Anesthesia QCDR form completed.        

## 2017-11-12 NOTE — Discharge Instructions (Signed)
AMBULATORY SURGERY  DISCHARGE INSTRUCTIONS   1) The drugs that you were given will stay in your system until tomorrow so for the next 24 hours you should not:  A) Drive an automobile B) Make any legal decisions C) Drink any alcoholic beverage   2) You may resume regular meals tomorrow.  Today it is better to start with liquids and gradually work up to solid foods.  You may eat anything you prefer, but it is better to start with liquids, then soup and crackers, and gradually work up to solid foods.   3) Please notify your doctor immediately if you have any unusual bleeding, trouble breathing, redness and pain at the surgery site, drainage, fever, or pain not relieved by medication.    4) Additional Instructions:        Please contact your physician with any problems or Same Day Surgery at [phone], Monday through Friday 6 am to 4 pm, or [place] at [facility] number at [phone].  Arthroscopic Knee Surgery - Meniscus Repair  Post-Op Instructions  1. Bracing or crutches: Crutches will be provided at the time of discharge from the surgery center.   2. Ice: You may be provided with a device [facility]) that allows you to ice the affected area effectively. Otherwise you can ice manually.   3. Driving:  Plan on not driving for at least four weeks. Please note that you are advised NOT to drive while taking narcotic pain medications as you may be impaired and unsafe to drive.  4. Activity: Ankle pumps several times an hour while awake to prevent blood clots. Weight bearing: NON-weight bearing on operative leg. Use crutches for at least 4 weeks, if not 6 based on your surgery. Bending and straightening the knee is unlimited, but do not flex your knee past 90 degrees until cleared by your therapist. Elevate knee above heart level as much as possible for one week. Avoid standing more than 5 minutes (consecutively) for the first week. No exercise involving the knee  until cleared by the surgeon or physical therapist.  Avoid long distance travel for 4 weeks.  5. Medications:  - You have been provided a prescription for narcotic pain medicine. After surgery, take 1-2 narcotic tablets every 4 hours if needed for severe pain.  - A prescription for anti-nausea medication will be provided in case the narcotic medicine causes nausea - take 1 tablet every 6 hours only if nauseated.  - Take ibuprofen 800 mg every 8 hours with food to reduce post-operative knee swelling. DO NOT STOP IBUPROFEN POST-OP UNTIL INSTRUCTED TO DO SO at first post-op office visit (10-14 days after surgery).  - Resume taking Xarelto the day after surgery.  -Take tylenol 1000 every 8 hours for pain.  May stop tylenol 3 days after surgery or when you are having minimal pain.  If you are taking prescription medication for anxiety, depression, insomnia, muscle spasm, chronic pain, or for attention deficit disorder you are advised that you are at a higher risk of adverse effects with use of narcotics post-op, including narcotic addiction/dependence, depressed breathing, death. If you use non-prescribed substances: alcohol, marijuana, cocaine, heroin, methamphetamines, etc., you are at a higher risk of adverse effects with use of narcotics post-op, including narcotic addiction/dependence, depressed breathing, death. You are advised that taking > 50 morphine milligram equivalents (MME) of narcotic pain medication per day results in twice the risk of overdose or death. For your prescription provided: oxycodone 5 mg - taking more  than 6 tablets per day. Be advised that we will prescribe narcotics short-term, for acute post-operative pain only - 1 week for minor operations such as knee arthroscopy for meniscus tear resection, and 3 weeks for major operations such as knee repair/reconstruction surgeries.   6. Bandages: The physical therapist should change the bandages at the first post-op appointment. If  needed, the dressing supplies have been provided to you.  7. Physical Therapy: 2 times per week for the first 4 weeks, then 1-2 times per week from weeks 4-8 post-op. Therapy typically starts on post operative Day 3 or 4. You have been provided an order for physical therapy. The therapist will provide home exercises.  8. Work: May return to full work when off of crutches. May do light duty/desk job in approximately 1-2 weeks when off of narcotics, pain is well-controlled, and swelling has decreased.  9. Post-Op Appointments: Your first post-op appointment will be with Dr. Allena KatzPatel in approximately 2 weeks time.   If you find that they have not been scheduled please call the Orthopaedic Appointment front desk at [phone].

## 2017-11-12 NOTE — Op Note (Signed)
DATE: 11/12/2017  PRE-OP DIAGNOSIS:  1. Left medial meniscus root tear 2. 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 Left medial meniscus root tear 2. 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 Left knee medial meniscus root repair  2. Left medial femoral condyle and patellofemoral chondroplasty    SURGEON:  Novella Olive, MD  ASSISTANT(S):  Dedra Skeens, [place]  ANESTHESIA: general  TOTAL IV FLUIDS: See anesthesia record  ESTIMATED BLOOD LOSS: 10cc  TOURNIQUET TIME:  97 min  DRAINS:  None.  SPECIMENS: None  IMPLANTS:  - Arthrex Biocomposite SwiveLock (x1)   COMPLICATIONS: None  INDICATIONS: [name] is a 42 y.o. female with L knee pain that has failed non-operative management. Clinical exam and radiographic studies were notable for left knee pain and swelling with instances of giving way with associated popping and catching on the medial aspect of her knee. Additionally, MRI showed a medial meniscus root tear. Given the poor long-term prognosis of a meniscus root tear and high likelihood of significant progression of osteoarthritis, we elected to proceed with the above procedure after a discussion of the risks, benefits, and alternatives to surgery. Additionally, MRI was suggestive of possible popliteal DVT. A DVT scan showed that there was a DVT. She was started on anticoagulation. An IVC filter was placed immediately prior to this procedure by Dr. Festus Barren. Please see his separate operative note for details of that procedure.   OPERATIVE FINDINGS:   Examination under anesthesia:A careful examination under anesthesia was performed. Passive range of motion was: Hyperextension: 2. Extension: 0. Flexion: 130. Lachman: normal. Pivot Shift: normal. Posterior drawer: normal. Varus stability in full extension: normal. Varus stability in 30 degrees of  flexion: normal. Valgus stability in full extension: normal. Valgus stability in 30 degrees of flexion: normal.  Intra-operative findings:A thorough arthroscopic examination of the knee was performed. The findings are: 1. Suprapatellar pouch: Normal 2. Undersurface of median ridge: Normal 3. Medial patellar facet: Grade 1 degenerative changes 4. Lateral patellar facet: Focal area of grade 2-3 changes ~10x52mm, otherwise grade 1 degenerative changes 5. Trochlea: normal 6. Lateral gutter/popliteus tendon: Normal 7. Hoffa's fat pad: Inflamed 8. Medial gutter/plica: Normal 9. ACL: Normal 10. PCL: Normal 11. Medial meniscus: Complete tear of the medial meniscus at the posterior root 12. Medial compartment cartilage: areas of Grade 1 changes on tibial plateau and MFC 13. Lateral meniscus: Normal 14. Lateral compartment cartilage: Grade 1 changes to tibial plateau  DESCRIPTION OF PROCEDURE: I identified [name] the pre-operative holding area. I marked theoperativeknee with my initials. I reviewed the risks and benefits of the proposed surgical intervention and the patient (and/or patient's guardian) wished to proceed. The patient was transferred to the operative suite and placed in the supine position with all bony prominences padded. Anesthesia was administered. Appropriate IV antibioticswere administered prior to incision. IVC filter was placed by Dr. Wyn Quaker. The extremity was then prepped and draped in standard fashion. A time out was performed confirming the correct extremity, correct patient, and correct procedure.  Arthroscopy portals were marked. Local anesthetic was injected to the planned portal sites. The anterolateral portalwasestablished with an 11blade. The arthroscope was placed in the anterolateral portal and theninto the suprapatellar pouch. A diagnostic knee scope was completed with the above findings. The medial meniscus root tear was identified. Next the medial  portal was established under needle localization. The MCL was pie-crusted to improve visualization of the posterior horn. Degenerative articular  cartilage from the medial compartment and patellofemoral compartments was debrided with a shaver until stable edges remained. The meniscus root tear was identified and probed to confirm our findings. Next, an Arthrex meniscus root aiming guide was used to mark out the tibial incision. An approximately 5cm vertical incision was made medial to the tibial tubercle. This was carried down to the sartorius fascia with bovie electrocautery, and the fascia was incised. An elevator was used to clear periosteum from the tibia in the area of the anticipated bone tunnel. Hemostasis was achieved. The guide was reinserted into the tibia and was placed over the anatomic footprint of the medial meniscus root by hooking the posterior tibial cortex and setting the guide to 5mm off the posterior cortex. We then used a 6.380mm FlipCutter to drill into the tibia and create a 7mm socket for the meniscus root. A FiberStick was passed through our tibial tunnel and into the joint. This was retrieved and passed through the anterolateral portal.   Next, we passed a Ceterix Novostitch 2-0 stitch just lateral to the root of the medial meniscus in a mattress fashion. A luggage tag stitch was created and passed into the joint, which showed excellent purchase of the meniscus root. This stitch and configuration was repeated ~9810mm medial to the first stitch. We ensured there was no soft tissue bridge between the sutures and pulled the passing stitch from the FiberStick through the anteromedial portal. We then used the passing stitch to bring the sutures in the meniscus out through the tibial tunnel. These were then passed through an [employer]l'srthrex SwiveLock anchor. Anchor hole was drilled ~3cm distal previously drilled tibial tunnel. Anchor was inserted with appropriate tension while visualizing the repair with  the arthroscope, and this achieved excellent interference fit. The meniscus root was probed and found to be stable.   Any loose bony debris was removed from the knee joint with a shaver and excess fluid was evacuated from the joint. Closure of the portals with 3-0 Nylon was performed. The sartorius fascia was closed 0 vicryl and the FiberWire suture from the SwiveLock. The subdermal layer of the tibial incision was closed with 2-0 vicryl and the skin was closed with 4-0 Monocryl in a running fashion. The knee was injected with local anesthetic. Xeroform gauze and dry sterile dressings were applied. A PolarCare and hinged knee brace were also applied.   Instrument, sponge, and needle counts were correct prior to wound closure and at the conclusion of the case.   Of note, assistance from a PA was essential to performing the surgery. PA assisted with patient position, retraction, and instrumentation.  DISPOSITION: PACU - hemodynamically stable.   POSTOPERATIVE PLAN: The patient will be discharged home today.    Non-weight bearing x 4 weeks, 50%WB from weeks 4-6. Resume anticoagulation with Xarelto on POD#. Narcotic medication, NSAID, and acetaminophen as discussed pre-operatively. The patient will be attending physical therapy beginning 3-4 days post-op. Physical therapy per Meniscus Repair (meniscus root repair) Protocol.   Patient to return to clinic ~2 weeks postop for suture removal.

## 2017-11-12 NOTE — Anesthesia Preprocedure Evaluation (Signed)
Anesthesia Evaluation  Patient identified by MRN, date of birth, ID band Patient awake    Reviewed: Allergy & Precautions, H&P , NPO status , Patient's Chart, lab work & pertinent test results, reviewed documented beta blocker date and time   History of Anesthesia Complications Negative for: history of anesthetic complications  Airway Mallampati: III  TM Distance: >3 FB Neck ROM: full    Dental  (+) Dental Advidsory Given, Teeth Intact   Pulmonary neg shortness of breath, asthma , neg sleep apnea, neg COPD, neg recent URI,           Cardiovascular Exercise Tolerance: Good hypertension, (-) angina+ DVT  (-) CAD, (-) Past MI, (-) Cardiac Stents and (-) CABG (-) dysrhythmias + Valvular Problems/Murmurs      Neuro/Psych negative neurological ROS  negative psych ROS   GI/Hepatic negative GI ROS, Neg liver ROS,   Endo/Other  diabetes (borderline)Hypothyroidism Morbid obesity  Renal/GU negative Renal ROS  negative genitourinary   Musculoskeletal   Abdominal   Peds  Hematology negative hematology ROS (+)   Anesthesia Other Findings Past Medical History: No date: Asthma No date: Diabetes mellitus without complication (HCC)     Comment:  weight loss s/p gastric sleeve. just monitored 10/2017: DVT (deep venous thrombosis) (HCC)     Comment:  right leg...to get an ivc filter prior to arthroscopy No date: Heart murmur No date: Hypertension No date: Hypothyroidism   Reproductive/Obstetrics negative OB ROS                             Anesthesia Physical Anesthesia Plan  ASA: III  Anesthesia Plan: General   Post-op Pain Management:    Induction: Intravenous  PONV Risk Score and Plan: 3 and Ondansetron and Dexamethasone  Airway Management Planned: Oral ETT  Additional Equipment:   Intra-op Plan:   Post-operative Plan: Extubation in OR  Informed Consent: I have reviewed the patients  History and Physical, chart, labs and discussed the procedure including the risks, benefits and alternatives for the proposed anesthesia with the patient or authorized representative who has indicated his/her understanding and acceptance.   Dental Advisory Given  Plan Discussed with: Anesthesiologist, CRNA and Surgeon  Anesthesia Plan Comments:         Anesthesia Quick Evaluation

## 2017-11-12 NOTE — Anesthesia Procedure Notes (Addendum)
Procedure Name: Intubation Date/Time: 11/12/2017 9:43 AM Performed by: Eben Burow, CRNA Pre-anesthesia Checklist: Patient identified, Emergency Drugs available, Suction available, Patient being monitored and Timeout performed Patient Re-evaluated:Patient Re-evaluated prior to induction Oxygen Delivery Method: Circle system utilized Preoxygenation: Pre-oxygenation with 100% oxygen Induction Type: IV induction and Rapid sequence Ventilation: Mask ventilation with difficulty, Two handed mask ventilation required and Oral airway inserted - appropriate to patient size Laryngoscope Size: McGraph and 3 Grade View: Grade II Tube type: Oral Tube size: 7.5 mm Number of attempts: 3 Airway Equipment and Method: Bougie stylet,  Stylet and LTA kit utilized Placement Confirmation: ETT inserted through vocal cords under direct vision,  positive ETCO2 and breath sounds checked- equal and bilateral Secured at: 22 cm Tube secured with: Tape Dental Injury: Injury to lip and Teeth and Oropharynx as per pre-operative assessment  Difficulty Due To: Difficulty was anticipated, Difficult Airway- due to reduced neck mobility and Difficult Airway- due to anterior larynx Future Recommendations: Recommend- induction with short-acting agent, and alternative techniques readily available

## 2017-11-12 NOTE — H&P (Signed)
[facility] History & Physical Update  The patient was interviewed and re-examined.  The patient's previous History and Physical has been reviewed and is unchanged.  There is no change in the plan of care. We plan to proceed with the scheduled procedure.  Sarah BarrenJason Shante Archambeault, MD  11/12/2017, 9:17 AM

## 2017-11-12 NOTE — Anesthesia Postprocedure Evaluation (Signed)
Anesthesia Post Note  Patient: [name]  Procedure(s) Performed: KNEE ARTHROSCOPY WITH MENISCAL REPAIR- ROOT REPAIR AND CHONDROPLASTY (Left Knee) INSERTION VENA-CAVA FILTER (Right Groin)  Patient location during evaluation: PACU Anesthesia Type: General Level of consciousness: awake and alert Pain management: pain level controlled Vital Signs Assessment: post-procedure vital signs reviewed and stable Respiratory status: spontaneous breathing, nonlabored ventilation, respiratory function stable and patient connected to nasal cannula oxygen Cardiovascular status: blood pressure returned to baseline and stable Postop Assessment: no apparent nausea or vomiting Anesthetic complications: no     Last Vitals:  Vitals:   11/12/17 1521 11/12/17 1619  BP: 139/82 (!) 149/84  Pulse: 92 97  Resp: 18 18  Temp: 36.8 C   SpO2: 95% 97%    Last Pain:  Vitals:   11/12/17 1619  TempSrc:   PainSc: 5                  Lenard SimmerAndrew Caylie Sandquist

## 2017-11-12 NOTE — OR Nursing (Signed)
Status post- IVC filter placement. Right groin dressing assessed- dry, clean and intact.

## 2017-11-12 NOTE — Op Note (Signed)
[facility]   OPERATIVE NOTE    PRE-OPERATIVE DIAGNOSIS: DVT and meniscus tear requiring surgery  POST-OPERATIVE DIAGNOSIS: same as above  PROCEDURE: 1.   Ultrasound guidance for vascular access to the right femoral vein 2.   Catheter placement into the inferior vena cava 3.   Inferior venacavogram 4.   Placement of a Cook Celect IVC filter  SURGEON: Festus BarrenJason Dew, MD  ASSISTANT(S): None  ANESTHESIA: general  ESTIMATED BLOOD LOSS: minimal  CONTRAST: 15 cc  FLUORO TIME: less than one minute  FINDING(S): 1.  Patent IVC  SPECIMEN(S):  none  INDICATIONS:   [name] is a 42 y.o. female who presents with a left leg DVT and a meniscus tear requiring surgery at this time.  Inferior vena cava filter is indicated for this reason.  Risks and benefits including filter thrombosis, migration, fracture, bleeding, and infection were all discussed.  We discussed that all IVC filters that we place can be removed if desired from the patient once the need for the filter has passed.    DESCRIPTION: After obtaining full informed written consent, the patient was brought back to the vascular suite. The skin was sterilely prepped and draped in a sterile surgical field was created. The patient was under general anesthesia for her knee surgery. The right femoral vein was accessed under direct ultrasound guidance without difficulty with a Seldinger needle and a J-wire was then placed. After skin nick and dilatation, the delivery sheath was placed into the inferior vena cava and an inferior venacavogram was performed. This demonstrated a patent IVC with the level of the renal veins at L1.  The filter was then deployed into the inferior vena cava at the level of L2 just below the renal veins. The delivery sheath was then removed. Pressure was held. Sterile dressings were placed. The patient tolerated the procedure well and was taken to the recovery room in stable  condition.  COMPLICATIONS: None  CONDITION: Stable  Festus BarrenJason Dew  11/12/2017, 10:10 AM   This note was created with Dragon Medical transcription system. Any errors in dictation are purely unintentional.

## 2017-11-13 ENCOUNTER — Encounter: Payer: Self-pay | Admitting: Orthopedic Surgery

## 2017-12-12 ENCOUNTER — Encounter: Payer: Self-pay | Admitting: Orthopedic Surgery

## 2017-12-25 ENCOUNTER — Encounter (INDEPENDENT_AMBULATORY_CARE_PROVIDER_SITE_OTHER): Payer: Self-pay

## 2017-12-25 ENCOUNTER — Ambulatory Visit (INDEPENDENT_AMBULATORY_CARE_PROVIDER_SITE_OTHER): Payer: Commercial Managed Care - PPO | Admitting: Vascular Surgery

## 2017-12-25 ENCOUNTER — Encounter (INDEPENDENT_AMBULATORY_CARE_PROVIDER_SITE_OTHER): Payer: Self-pay | Admitting: Vascular Surgery

## 2017-12-25 VITALS — BP 145/86 | HR 81 | Resp 16 | Wt 237.0 lb

## 2017-12-25 DIAGNOSIS — I824Z2 Acute embolism and thrombosis of unspecified deep veins of left distal lower extremity: Secondary | ICD-10-CM

## 2017-12-25 DIAGNOSIS — I10 Essential (primary) hypertension: Secondary | ICD-10-CM | POA: Diagnosis not present

## 2017-12-25 NOTE — Assessment & Plan Note (Signed)
blood pressure control important in reducing the progression of atherosclerotic disease. On appropriate oral medications.  

## 2017-12-25 NOTE — Progress Notes (Signed)
  MRN : [id number]  [name] is a 42 y.o. (08/27/1976) female who presents with chief complaint of  Chief Complaint  Patient presents with  . Follow-up    discuss ivc removal  .  History of Present Illness: Patient returns today in follow up of her DVT and IVC  Filter removal.  She is now about 6 weeks status post her knee surgery and has regained most of her mobility.  She is still in a brace which will be coming off in about 10 days.  She reports no chest pain or shortness of breath.  No increased leg swelling.  She is tolerating anticoagulation well.       Past Medical History:  Diagnosis Date  . Asthma   . Diabetes mellitus without complication (HCC)   . Hypertension   . Hypothyroidism           Family History  Problem Relation Age of Onset  . Diabetes Mother   . Varicose Veins Mother   . Heart disease Father   . Diabetes Father   . Hypertension Father   . Cancer Sister     Social History Social History   Tobacco Use  . Smoking status: Never Smoker  . Smokeless tobacco: Never Used  Substance Use Topics  . Alcohol use: No    Frequency: Never  . Drug use: Not on file  Works as a nurse  No Known Allergies        Current Outpatient Medications  Medication Sig Dispense Refill  . aspirin EC 81 MG tablet Take by mouth.    . budesonide-formoterol (SYMBICORT) 80-4.5 MCG/ACT inhaler INHALE TWO PUFFS BY MOUTH TWICE DAILY    . Cholecalciferol (VITAMIN D3) 2000 units capsule Take by mouth.    . levalbuterol (XOPENEX HFA) 45 MCG/ACT inhaler INHALE TWO PUFFS INTO LUNGS AS NEEDED AS DIRECTED    . levothyroxine (SYNTHROID, LEVOTHROID) 88 MCG tablet Take by mouth.    . liothyronine (CYTOMEL) 5 MCG tablet Take by mouth.    . losartan (COZAAR) 50 MG tablet 1 tablet daily. NEEDS APPOINTMENT FOR FURTHER REFILLS    . Rivaroxaban (XARELTO STARTER PACK) 15 & 20 MG TBPK Take by mouth.    . traMADol (ULTRAM) 50 MG tablet Take by mouth.      No current facility-administered medications for this visit.       REVIEW OF SYSTEMS (Negative unless checked)  Constitutional: []Weight loss  []Fever  []Chills Cardiac: []Chest pain   []Chest pressure   []Palpitations   []Shortness of breath when laying flat   []Shortness of breath at rest   []Shortness of breath with exertion. Vascular:  [x]Pain in legs with walking   [x]Pain in legs at rest   [x]Pain in legs when laying flat   []Claudication   []Pain in feet when walking  []Pain in feet at rest  []Pain in feet when laying flat   [x]History of DVT   []Phlebitis   [x]Swelling in legs   []Varicose veins   []Non-healing ulcers Pulmonary:   []Uses home oxygen   []Productive cough   []Hemoptysis   []Wheeze  []COPD   []Asthma Neurologic:  []Dizziness  []Blackouts   []Seizures   []History of stroke   []History of TIA  []Aphasia   []Temporary blindness   []Dysphagia   []Weakness or numbness in arms   []Weakness or numbness in legs Musculoskeletal:  []Arthritis   []Joint swelling   []Joint pain   []Low back pain Hematologic:  []  Easy bruising  []Easy bleeding   []Hypercoagulable state   []Anemic  []Hepatitis Gastrointestinal:  []Blood in stool   []Vomiting blood  []Gastroesophageal reflux/heartburn   []Abdominal pain Genitourinary:  []Chronic kidney disease   []Difficult urination  []Frequent urination  []Burning with urination   []Hematuria Skin:  []Rashes   []Ulcers   []Wounds Psychological:  []History of anxiety   [] History of major depression.      Physical Examination  BP (!) 145/86 (BP Location: Right Arm)   Pulse 81   Resp 16   Wt 107.5 kg (237 lb)   BMI 43.35 kg/m  Gen:  WD/WN, NAD Head: [place]/AT, No temporalis wasting. Ear/Nose/Throat: Hearing grossly intact, nares w/o erythema or drainage, trachea midline Eyes: Conjunctiva clear. Sclera non-icteric Neck: Supple.  No JVD.  Pulmonary:  Good air movement, no use of accessory muscles.  Cardiac: RRR, normal S1,  S2 Vascular:  Vessel Right Left  Radial Palpable Palpable                          PT Palpable Palpable  DP Palpable Palpable    Musculoskeletal: M/S 5/5 throughout. Left leg in a brace. Trace LLE edema. Neurologic: Sensation grossly intact in extremities.  Symmetrical.  Speech is fluent.  Psychiatric: Judgment intact, Mood & affect appropriate for pt's clinical situation. Dermatologic: No rashes or ulcers noted.  No cellulitis or open wounds.       Labs Recent Results (from the past 2160 hour(s))  CBC     Status: Abnormal   Collection Time: 11/08/17 10:41 AM  Result Value Ref Range   WBC 4.2 3.6 - 11.0 K/uL   RBC 4.44 3.80 - 5.20 MIL/uL   Hemoglobin 12.0 12.0 - 16.0 g/dL   HCT 37.6 35.0 - 47.0 %   MCV 84.6 80.0 - 100.0 fL   MCH 27.1 26.0 - 34.0 pg   MCHC 32.0 32.0 - 36.0 g/dL   RDW 15.2 (H) 11.5 - 14.5 %   Platelets 191 150 - 440 K/uL    Comment: Performed at [facility], [address] [place], [place] [address]  Basic metabolic panel     Status: Abnormal   Collection Time: 11/08/17 10:41 AM  Result Value Ref Range   Sodium 136 135 - 145 mmol/L   Potassium 3.9 3.5 - 5.1 mmol/L   Chloride 104 101 - 111 mmol/L   CO2 24 22 - 32 mmol/L   Glucose, Bld 156 (H) 65 - 99 mg/dL   BUN 12 6 - 20 mg/dL   Creatinine, Ser 0.66 0.44 - 1.00 mg/dL   Calcium 8.9 8.9 - 10.3 mg/dL   GFR calc non Af Amer >60 >60 mL/min   GFR calc Af Amer >60 >60 mL/min    Comment: (NOTE) The eGFR has been calculated using the CKD EPI equation. This calculation has not been validated in all clinical situations. eGFR's persistently <60 mL/min signify possible Chronic Kidney Disease.    Anion gap 8 5 - 15    Comment: Performed at [facility], [address] [place], [place] [address]  Glucose, capillary     Status: Abnormal   Collection Time: 11/12/17  7:43 AM  Result Value Ref Range   Glucose-Capillary 130 (H) 65 - 99 mg/dL  Glucose, capillary     Status: Abnormal    Collection Time: 11/12/17  1:02 PM  Result Value Ref Range   Glucose-Capillary 194 (H) 65 - 99 mg/dL  Radiology No results found.   Assessment/Plan  Hypertension blood pressure control important in reducing the progression of atherosclerotic disease. On appropriate oral medications.   DVT of lower limb, acute (HCC) Now that she is regaining her full mobility, we can consider removing her IVC filter.  She is tolerating anticoagulation.  Given the fact that this was a reasonably distal, provoked DVT 3 months of anticoagulation would likely be appropriate.  After she gets her filter out in a few weeks, we can see her back a few weeks after that to discuss cessation of anticoagulation.  Risks and benefits of filter removal were discussed with the patient and she is agreeable to proceed.    Jason Dew, MD  12/25/2017 11:07 AM    This note was created with Dragon medical transcription system.  Any errors from dictation are purely unintentional 

## 2017-12-25 NOTE — Assessment & Plan Note (Signed)
Now that she is regaining her full mobility, we can consider removing her IVC filter.  She is tolerating anticoagulation.  Given the fact that this was a reasonably distal, provoked DVT 3 months of anticoagulation would likely be appropriate.  After she gets her filter out in a few weeks, we can see her back a few weeks after that to discuss cessation of anticoagulation.  Risks and benefits of filter removal were discussed with the patient and she is agreeable to proceed.

## 2017-12-25 NOTE — Patient Instructions (Signed)
Deep Vein Thrombosis Deep vein thrombosis (DVT) is a condition in which a blood clot forms in a deep vein, such as a lower leg, thigh, or arm vein. A clot is blood that has thickened into a gel or solid. This condition is dangerous. It can lead to serious and even life-threatening complications if the clot travels to the lungs and causes a blockage (pulmonary embolism). It can also damage veins in the leg. This can result in leg pain, swelling, discoloration, and sores (post-thrombotic syndrome). What are the causes? This condition may be caused by:  A slowdown of blood flow.  Damage to a vein.  A condition that makes blood clot more easily.  What increases the risk? The following factors may make you more likely to develop this condition:  Being overweight.  Being elderly, especially over age 60.  Sitting or lying down for more than four hours.  Lack of physical activity (sedentary lifestyle).  Being pregnant, giving birth, or having recently given birth.  Taking medicines that contain estrogen.  Smoking.  A history of any of the following: ? Blood clots or blood clotting disease. ? Peripheral vascular disease. ? Inflammatory bowel disease. ? Cancer. ? Heart disease. ? Genetic conditions that affect how blood clots. ? Neurological diseases that affect the legs (leg paresis). ? Injury. ? Major or lengthy surgery. ? A central line placed inside a large vein.  What are the signs or symptoms? Symptoms of this condition include:  Swelling, pain, or tenderness in an arm or leg.  Warmth, redness, or discoloration in an arm or leg.  If the clot is in your leg, symptoms may be more noticeable or worse when you stand or walk. Some people do not have any symptoms. How is this diagnosed? This condition is diagnosed with:  A medical history.  A physical exam.  Tests, such as: ? Blood tests. These are done to see how your blood clots. ? Imaging tests. These are done to  check for clots. Tests may include:  Ultrasound.  CT scan.  MRI.  X-ray.  Venogram. For this test, X-rays are taken after a dye is injected into a vein.  How is this treated? Treatment for this condition depends on the cause, your risk for bleeding or developing more clots, and any medical conditions you have. Treatment may include:  Taking blood thinners (also called anticoagulants). These medicines may be taken by mouth, injected under the skin, or injected through an IV tube (catheter). These medicines prevent clots from forming.  Injecting medicine that dissolves blood clots into the affected vein (catheter-directed thrombolysis).  Having surgery. Surgery may be done to: ? Remove the clot. ? Place a filter in a large vein to catch blood clots before they reach the lungs.  Some treatments may be continued for up to six months. Follow these instructions at home: If you are taking an oral blood thinner:  Take the medicine exactly as told by your health care provider. Some blood thinners need to be taken at the same time every day. Do not skip a dose.  Ask your health care provider about what foods and drugs interact with the medicine.  Ask about possible side effects. General instructions  Blood thinners can cause easy bruising and difficulty stopping bleeding. Because of this, if you are taking or were given a blood thinner: ? Hold pressure over cuts for longer than usual. ? Tell your dentist and other health care providers that you are taking blood thinners before   having any procedures that can cause bleeding. ? Avoid contact sports.  Take over-the-counter and prescription medicines only as told by your health care provider.  Return to your normal activities as told by your health care provider. Ask your health care provider what activities are safe for you.  Wear compression stockings if recommended by your health care provider.  Keep all follow-up visits as told by  your health care provider. This is important. How is this prevented? To lower your risk of developing this condition again:  For 30 or more minutes every day, do an activity that: ? Involves moving your arms and legs. ? Increases your heart rate.  When traveling for longer than four hours: ? Exercise your arms and legs every hour. ? Drink plenty of water. ? Avoid drinking alcohol.  Avoid sitting or lying for a long time without moving your legs.  Stay a healthy weight.  If you are a woman who is older than age 35, avoid unnecessary use of medicines that contain estrogen.  Do not use any products that contain nicotine or tobacco, such as cigarettes and e-cigarettes. This is especially important if you take estrogen medicines. If you need help quitting, ask your health care provider.  Contact a health care provider if:  You miss a dose of your blood thinner.  You have nausea, vomiting, or diarrhea that lasts for more than one day.  Your menstrual period is heavier than usual.  You have unusual bruising. Get help right away if:  You have new or increased pain, swelling, or redness in an arm or leg.  You have numbness or tingling in an arm or leg.  You have shortness of breath.  You have chest pain.  You have a rapid or irregular heartbeat.  You feel light-headed or dizzy.  You cough up blood.  There is blood in your vomit, stool, or urine.  You have a serious fall or accident, or you hit your head.  You have a severe headache or confusion.  You have a cut that will not stop bleeding. These symptoms may represent a serious problem that is an emergency. Do not wait to see if the symptoms will go away. Get medical help right away. Call your local emergency services (911 in the [place] Do not drive yourself to the hospital. Summary  DVT is a condition in which a blood clot forms in a deep vein, such as a lower leg, thigh, or arm vein.  Symptoms can include swelling,  warmth, pain, and redness in your leg or arm.  Treatment may include taking blood thinners, injecting medicine that dissolves blood clots,wearing compression stockings, or surgery.  If you are prescribed blood thinners, take them exactly as told. This information is not intended to replace advice given to you by your health care provider. Make sure you discuss any questions you have with your health care provider. Document Released: 09/25/2005 Document Revised: 10/28/2016 Document Reviewed: 10/28/2016 Elsevier Interactive Patient Education  2018 [employer]  

## 2018-01-07 ENCOUNTER — Other Ambulatory Visit (INDEPENDENT_AMBULATORY_CARE_PROVIDER_SITE_OTHER): Payer: Self-pay | Admitting: Vascular Surgery

## 2018-01-09 MED ORDER — CEFAZOLIN SODIUM-DEXTROSE 2-4 GM/100ML-% IV SOLN: 2.0000 g | Freq: Once | INTRAVENOUS | Status: DC

## 2018-01-10 ENCOUNTER — Ambulatory Visit
Admission: RE | Admit: 2018-01-10 | Discharge: 2018-01-10 | Disposition: A | Discharge status: Home / Self Care | Payer: Commercial Managed Care - PPO | Source: Ambulatory Visit | Attending: Vascular Surgery | Admitting: Vascular Surgery

## 2018-01-10 ENCOUNTER — Encounter: Payer: Self-pay | Admitting: Emergency Medicine

## 2018-01-10 ENCOUNTER — Encounter
Admission: RE | Disposition: A | Discharge status: Home / Self Care | Payer: Self-pay | Source: Ambulatory Visit | Attending: Vascular Surgery

## 2018-01-10 DIAGNOSIS — I10 Essential (primary) hypertension: Secondary | ICD-10-CM | POA: Diagnosis not present

## 2018-01-10 DIAGNOSIS — Z7902 Long term (current) use of antithrombotics/antiplatelets: Secondary | ICD-10-CM | POA: Diagnosis not present

## 2018-01-10 DIAGNOSIS — Z452 Encounter for adjustment and management of vascular access device: Secondary | ICD-10-CM | POA: Diagnosis not present

## 2018-01-10 DIAGNOSIS — J45909 Unspecified asthma, uncomplicated: Secondary | ICD-10-CM | POA: Diagnosis not present

## 2018-01-10 DIAGNOSIS — E119 Type 2 diabetes mellitus without complications: Secondary | ICD-10-CM | POA: Insufficient documentation

## 2018-01-10 DIAGNOSIS — Z79899 Other long term (current) drug therapy: Secondary | ICD-10-CM | POA: Diagnosis not present

## 2018-01-10 DIAGNOSIS — Z79890 Hormone replacement therapy (postmenopausal): Secondary | ICD-10-CM | POA: Insufficient documentation

## 2018-01-10 DIAGNOSIS — Z95828 Presence of other vascular implants and grafts: Secondary | ICD-10-CM | POA: Insufficient documentation

## 2018-01-10 DIAGNOSIS — E079 Disorder of thyroid, unspecified: Secondary | ICD-10-CM | POA: Diagnosis not present

## 2018-01-10 DIAGNOSIS — Z833 Family history of diabetes mellitus: Secondary | ICD-10-CM | POA: Diagnosis not present

## 2018-01-10 DIAGNOSIS — Z7982 Long term (current) use of aspirin: Secondary | ICD-10-CM | POA: Insufficient documentation

## 2018-01-10 DIAGNOSIS — Z8249 Family history of ischemic heart disease and other diseases of the circulatory system: Secondary | ICD-10-CM | POA: Diagnosis not present

## 2018-01-10 DIAGNOSIS — Z832 Family history of diseases of the blood and blood-forming organs and certain disorders involving the immune mechanism: Secondary | ICD-10-CM | POA: Insufficient documentation

## 2018-01-10 DIAGNOSIS — I824Z2 Acute embolism and thrombosis of unspecified deep veins of left distal lower extremity: Secondary | ICD-10-CM | POA: Insufficient documentation

## 2018-01-10 DIAGNOSIS — I82409 Acute embolism and thrombosis of unspecified deep veins of unspecified lower extremity: Secondary | ICD-10-CM

## 2018-01-10 HISTORY — PX: IVC FILTER REMOVAL: CATH118246

## 2018-01-10 SURGERY — IVC FILTER REMOVAL
Anesthesia: Moderate Sedation

## 2018-01-10 MED ORDER — MIDAZOLAM HCL 2 MG/2ML IJ SOLN
INTRAMUSCULAR | Status: DC | PRN
  Administered 2018-01-10: 2 mg via INTRAVENOUS
  Administered 2018-01-10 (×3): 1 mg via INTRAVENOUS

## 2018-01-10 MED ORDER — DIPHENHYDRAMINE HCL 50 MG/ML IJ SOLN
50.0000 mg | Freq: Once | INTRAMUSCULAR | Status: AC
  Administered 2018-01-10: 50 mg via INTRAVENOUS

## 2018-01-10 MED ORDER — HEPARIN (PORCINE) IN NACL 2-0.9 UNIT/ML-% IJ SOLN
INTRAMUSCULAR | Status: AC
Start: 2018-01-10 — End: ?
  Filled 2018-01-10: qty 500

## 2018-01-10 MED ORDER — LIDOCAINE-EPINEPHRINE (PF) 1 %-1:200000 IJ SOLN
INTRAMUSCULAR | Status: AC
  Filled 2018-01-10: qty 30

## 2018-01-10 MED ORDER — FENTANYL CITRATE (PF) 100 MCG/2ML IJ SOLN
INTRAMUSCULAR | Status: AC
  Filled 2018-01-10: qty 2

## 2018-01-10 MED ORDER — SODIUM CHLORIDE 0.9 % IV SOLN
INTRAVENOUS | Status: DC
  Administered 2018-01-10: 07:00:00 via INTRAVENOUS

## 2018-01-10 MED ORDER — HYDROMORPHONE HCL 1 MG/ML IJ SOLN: 1.0000 mg | Freq: Once | INTRAMUSCULAR | Status: DC | PRN

## 2018-01-10 MED ORDER — IOPAMIDOL (ISOVUE-300) INJECTION 61%
INTRAVENOUS | Status: DC | PRN
  Administered 2018-01-10: 15 mL via INTRA_ARTERIAL

## 2018-01-10 MED ORDER — FAMOTIDINE 20 MG PO TABS
40.0000 mg | ORAL_TABLET | Freq: Once | ORAL | Status: AC
  Administered 2018-01-10: 40 mg via ORAL

## 2018-01-10 MED ORDER — DIPHENHYDRAMINE HCL 50 MG/ML IJ SOLN
INTRAMUSCULAR | Status: AC
  Administered 2018-01-10: 50 mg via INTRAVENOUS
  Filled 2018-01-10: qty 1

## 2018-01-10 MED ORDER — METHYLPREDNISOLONE SODIUM SUCC 125 MG IJ SOLR
INTRAMUSCULAR | Status: AC
  Administered 2018-01-10: 125 mg via INTRAVENOUS
  Filled 2018-01-10: qty 2

## 2018-01-10 MED ORDER — HEPARIN SODIUM (PORCINE) 1000 UNIT/ML IJ SOLN
INTRAMUSCULAR | Status: AC
  Filled 2018-01-10: qty 1

## 2018-01-10 MED ORDER — ONDANSETRON HCL 4 MG/2ML IJ SOLN: 4.0000 mg | Freq: Four times a day (QID) | INTRAMUSCULAR | Status: DC | PRN

## 2018-01-10 MED ORDER — MIDAZOLAM HCL 5 MG/5ML IJ SOLN
INTRAMUSCULAR | Status: AC
  Filled 2018-01-10: qty 5

## 2018-01-10 MED ORDER — FAMOTIDINE 20 MG PO TABS
ORAL_TABLET | ORAL | Status: AC
  Administered 2018-01-10: 40 mg via ORAL
  Filled 2018-01-10: qty 2

## 2018-01-10 MED ORDER — CEFAZOLIN SODIUM-DEXTROSE 1-4 GM/50ML-% IV SOLN
INTRAVENOUS | Status: AC
  Administered 2018-01-10: 08:00:00
  Filled 2018-01-10: qty 50

## 2018-01-10 MED ORDER — METHYLPREDNISOLONE SODIUM SUCC 125 MG IJ SOLR
125.0000 mg | Freq: Once | INTRAMUSCULAR | Status: AC
  Administered 2018-01-10: 125 mg via INTRAVENOUS

## 2018-01-10 MED ORDER — FENTANYL CITRATE (PF) 100 MCG/2ML IJ SOLN
INTRAMUSCULAR | Status: DC | PRN
  Administered 2018-01-10: 25 ug via INTRAVENOUS
  Administered 2018-01-10: 50 ug via INTRAVENOUS
  Administered 2018-01-10: 25 ug via INTRAVENOUS

## 2018-01-10 SURGICAL SUPPLY — 4 items
COVER PROBE U/S 5X48 (MISCELLANEOUS) ×1 IMPLANT
PACK ANGIOGRAPHY (CUSTOM PROCEDURE TRAY) ×1 IMPLANT
SET VENACAVA FILTER RETRIEVAL (MISCELLANEOUS) ×1 IMPLANT
WIRE J 3MM .035X145CM (WIRE) ×1 IMPLANT

## 2018-01-10 NOTE — H&P (Signed)
[facility] History & Physical Update  The patient was interviewed and re-examined.  The patient's previous History and Physical has been reviewed and is unchanged.  There is no change in the plan of care. We plan to proceed with the scheduled procedure.  Festus BarrenJason Justine Dines, MD  01/10/2018, 8:11 AM

## 2018-01-10 NOTE — Discharge Instructions (Signed)
Inferior Vena Cava Filter removal, Care After This sheet gives you information about how to care for yourself after your procedure. Your health care provider may also give you more specific instructions. If you have problems or questions, contact your health care provider. What can I expect after the procedure? After your procedure, it is common to have:  Mild pain in the area where the filter was inserted.  Mild bruising in the area where the filter was inserted.  Follow these instructions at home: Insertion site care  Follow instructions from your health care provider about how to take care of the site where a catheter was inserted at your neck or groin (insertion site). Make sure you: ? Wash your hands with soap and water before you change your bandage (dressing). If soap and water are not available, use hand sanitizer. ? Change your dressing as told by your health care provider.  Check your insertion site every day for signs of infection. Check for: ? More redness, swelling, or pain. ? More fluid or blood. ? Warmth. ? Pus or a bad smell.  Keep the insertion site clean and dry.  Do not shower, bathe, use a hot tub, or let the dressing get wet until your health care provider approves. General instructions  Take over-the-counter and prescription medicines only as told by your health care provider.  Avoid heavy lifting or hard activities for 48 hours after the procedure or as told by your health care provider.  Do not drive for 24 hours if you were given a a medicine to help you relax (sedative).  Do not drive or use heavy machinery while taking prescription pain medicine.  Do not go back to school or work until your health care provider approves.  Keep all follow-up visits as told by your health care provider. This is important. Contact a health care provider if:  You have more redness, swelling, or pain around your insertion site.  You have more fluid or blood coming from  your insertion site.  Your insertion site feels warm to the touch.  You have pus or a bad smell coming from your insertion site.  You have a fever.  You are dizzy.  You have nausea and vomiting.  You develop a rash. Get help right away if:  You develop chest pain, a cough, or difficulty breathing.  You develop shortness of breath, feel faint, or pass out.  You cough up blood.  You have severe pain in your abdomen.  You develop swelling and discoloration or pain in your legs.  Your legs become pale and cold or blue.  You develop weakness, difficulty moving your arms or legs, or balance problems.  You develop problems with speech or vision. These symptoms may represent a serious problem that is an emergency. Do not wait to see if the symptoms will go away. Get medical help right away. Call your local emergency services (911 in the [place] Do not drive yourself to the hospital. Summary  After your insertion procedure, it is common to have mild pain and bruising.  Do not shower, bathe, use a hot tub, or let the dressing get wet until your health care provider approves.  Every day, check for signs of infection where a catheter was inserted at your neck site. This information is not intended to replace advice given to you by your health care provider. Make sure you discuss any questions you have with your health care provider. Document Released: 07/16/2013 Document Revised: 08/16/2016 Document  Reviewed: 08/16/2016 Elsevier Interactive Patient Education  2017 [employer].

## 2018-01-10 NOTE — Op Note (Signed)
    OPERATIVE NOTE   PROCEDURE: 1. Ultrasound guidance for vascular access to right jugular vein. 2. Catheter placement into IVC from right jugular vein. 3. Inferior venacavogram. 4. Retrieval of IVC filter.  PRE-OPERATIVE DIAGNOSIS: 1. Status post IVC filter for previous DVT    POST-OPERATIVE DIAGNOSIS: Same as above  SURGEON: Festus BarrenJason Megean Fabio, MD  ASSISTANT(S): None  ANESTHESIA: local with Moderate Conscious Sedation for approximately 20 minutes using 5 mg of Versed and 100 mcg of Fentanyl  ESTIMATED BLOOD LOSS: 3 cc  CONTRAST: 15 cc  FLUORO TIME: 2.1 minutes  FINDING(S): 1. Patent IVC  SPECIMEN(S): Filter retrieved and disposed of  INDICATIONS:  Patient is a 42 y.o.female who presents with a previous history of IVC filter placement for DVT with knee surgery requiring cessation of anticoagulation.  The patient desires removal of the filter to avoid the small lifetime risks of perforation, infection, thrombosis, migration, and stent fracture.  Risks and benefits of removal were discussed and the patient is agreeable to proceed.  The patient understands that the filter may not be able to be retrieved if the top of the filter has embedded in the caval wall.   DESCRIPTION: After obtaining full informed written consent, the patient was brought back to the operating room and placed supine upon the operating table. After obtaining adequate sedation, the patient was prepped and draped in the standard fashion. Moderate conscious sedation was administered during the face to face encounter with the patient throughout the procedure with my supervision of the RN administering medicines and monitoring the patient's vital signs, pulse oximetry, telemetry and mental status throughout from the start of the procedure until the patient was taken to the recovery room.  The right jugular vein was visualized with ultrasound and found to be widely patent. This was accessed under direct ultrasound  guidance with a Seldinger needle and a permanent image was recorded. A J-wire was then placed. After skin nick and dilatation, the retrieval sheath was placed over the wire into the inferior vena cava. Inferior venacavogram was then performed. The IVC was found to be widely patent and the filter was in good location. I then was able to snare the hook on the top of the filter and advanced the sheath over the filter collapsing it and bringing it into the sheath in its entirety. It was then removed through the sheath in its entirety. The sheath was then removed and pressure was held on the neck. The patient tolerated the procedure well and was taken to the recovery room in stable condition.  COMPLICATIONS: None  CONDITION: Stable   Festus BarrenJason Laterrance Nauta 01/10/2018 8:58 AM  This note was created with Dragon Medical transcription system. Any errors in dictation are purely unintentional.

## 2018-01-11 ENCOUNTER — Encounter: Payer: Self-pay | Admitting: Vascular Surgery

## 2018-02-19 DIAGNOSIS — Z9889 Other specified postprocedural states: Secondary | ICD-10-CM | POA: Diagnosis not present

## 2018-02-22 ENCOUNTER — Ambulatory Visit (INDEPENDENT_AMBULATORY_CARE_PROVIDER_SITE_OTHER): Payer: Commercial Managed Care - PPO | Admitting: Vascular Surgery

## 2018-03-11 DIAGNOSIS — H524 Presbyopia: Secondary | ICD-10-CM | POA: Diagnosis not present

## 2018-03-11 DIAGNOSIS — H5213 Myopia, bilateral: Secondary | ICD-10-CM | POA: Diagnosis not present

## 2018-03-11 DIAGNOSIS — H52222 Regular astigmatism, left eye: Secondary | ICD-10-CM | POA: Diagnosis not present

## 2018-03-15 DIAGNOSIS — E039 Hypothyroidism, unspecified: Secondary | ICD-10-CM | POA: Diagnosis not present

## 2018-03-28 ENCOUNTER — Other Ambulatory Visit: Payer: Self-pay | Admitting: Obstetrics & Gynecology

## 2018-03-28 DIAGNOSIS — Z1231 Encounter for screening mammogram for malignant neoplasm of breast: Secondary | ICD-10-CM | POA: Diagnosis not present

## 2018-03-28 DIAGNOSIS — R87612 Low grade squamous intraepithelial lesion on cytologic smear of cervix (LGSIL): Secondary | ICD-10-CM | POA: Diagnosis not present

## 2018-03-28 DIAGNOSIS — Z01419 Encounter for gynecological examination (general) (routine) without abnormal findings: Secondary | ICD-10-CM | POA: Diagnosis not present

## 2018-04-03 DIAGNOSIS — E039 Hypothyroidism, unspecified: Secondary | ICD-10-CM | POA: Diagnosis not present

## 2018-04-09 DIAGNOSIS — R87612 Low grade squamous intraepithelial lesion on cytologic smear of cervix (LGSIL): Secondary | ICD-10-CM | POA: Diagnosis not present

## 2018-04-10 ENCOUNTER — Ambulatory Visit
Admission: RE | Admit: 2018-04-10 | Discharge: 2018-04-10 | Disposition: A | Discharge status: Home / Self Care | Payer: Commercial Managed Care - PPO | Source: Ambulatory Visit | Attending: Obstetrics & Gynecology | Admitting: Obstetrics & Gynecology

## 2018-04-10 DIAGNOSIS — Z1231 Encounter for screening mammogram for malignant neoplasm of breast: Secondary | ICD-10-CM | POA: Diagnosis not present

## 2018-04-18 ENCOUNTER — Other Ambulatory Visit: Payer: Self-pay | Admitting: *Deleted

## 2018-04-18 ENCOUNTER — Inpatient Hospital Stay
Admission: RE | Admit: 2018-04-18 | Discharge: 2018-04-18 | Disposition: A | Discharge status: Home / Self Care | Payer: Self-pay | Source: Ambulatory Visit | Attending: *Deleted | Admitting: *Deleted

## 2018-04-18 DIAGNOSIS — Z9289 Personal history of other medical treatment: Secondary | ICD-10-CM

## 2019-01-28 DIAGNOSIS — E039 Hypothyroidism, unspecified: Secondary | ICD-10-CM | POA: Diagnosis not present

## 2019-01-28 DIAGNOSIS — E7800 Pure hypercholesterolemia, unspecified: Secondary | ICD-10-CM | POA: Diagnosis not present

## 2019-01-28 DIAGNOSIS — I10 Essential (primary) hypertension: Secondary | ICD-10-CM | POA: Diagnosis not present

## 2019-01-28 DIAGNOSIS — J4520 Mild intermittent asthma, uncomplicated: Secondary | ICD-10-CM | POA: Diagnosis not present

## 2019-01-28 DIAGNOSIS — Z0000 Encounter for general adult medical examination without abnormal findings: Secondary | ICD-10-CM | POA: Diagnosis not present

## 2019-01-28 DIAGNOSIS — E118 Type 2 diabetes mellitus with unspecified complications: Secondary | ICD-10-CM | POA: Diagnosis not present

## 2019-05-15 ENCOUNTER — Other Ambulatory Visit: Payer: Self-pay | Admitting: Obstetrics & Gynecology

## 2019-05-15 DIAGNOSIS — Z1231 Encounter for screening mammogram for malignant neoplasm of breast: Secondary | ICD-10-CM

## 2019-05-26 ENCOUNTER — Ambulatory Visit
Admission: RE | Admit: 2019-05-26 | Discharge: 2019-05-26 | Disposition: A | Discharge status: Home / Self Care | Payer: 59 | Source: Ambulatory Visit | Attending: Obstetrics & Gynecology | Admitting: Obstetrics & Gynecology

## 2019-05-26 DIAGNOSIS — Z1231 Encounter for screening mammogram for malignant neoplasm of breast: Secondary | ICD-10-CM

## 2020-06-17 ENCOUNTER — Other Ambulatory Visit: Payer: Self-pay | Admitting: Obstetrics & Gynecology

## 2020-06-17 DIAGNOSIS — Z1231 Encounter for screening mammogram for malignant neoplasm of breast: Secondary | ICD-10-CM

## 2020-07-07 ENCOUNTER — Ambulatory Visit
Admission: RE | Admit: 2020-07-07 | Discharge: 2020-07-07 | Disposition: A | Discharge status: Home / Self Care | Payer: BC Managed Care – PPO | Source: Ambulatory Visit | Attending: Obstetrics & Gynecology | Admitting: Obstetrics & Gynecology

## 2020-07-07 ENCOUNTER — Other Ambulatory Visit: Payer: Self-pay

## 2020-07-07 DIAGNOSIS — Z1231 Encounter for screening mammogram for malignant neoplasm of breast: Secondary | ICD-10-CM | POA: Diagnosis present

## 2020-09-24 ENCOUNTER — Other Ambulatory Visit: Payer: Self-pay | Admitting: Student

## 2020-09-24 DIAGNOSIS — M7581 Other shoulder lesions, right shoulder: Secondary | ICD-10-CM

## 2020-09-24 DIAGNOSIS — M79601 Pain in right arm: Secondary | ICD-10-CM

## 2020-10-03 ENCOUNTER — Other Ambulatory Visit: Payer: Self-pay

## 2020-10-03 ENCOUNTER — Ambulatory Visit
Admission: RE | Admit: 2020-10-03 | Discharge: 2020-10-03 | Disposition: A | Discharge status: Home / Self Care | Payer: BC Managed Care – PPO | Source: Ambulatory Visit | Attending: Student | Admitting: Student

## 2020-10-03 DIAGNOSIS — M7581 Other shoulder lesions, right shoulder: Secondary | ICD-10-CM

## 2020-10-03 DIAGNOSIS — M79601 Pain in right arm: Secondary | ICD-10-CM

## 2020-11-08 ENCOUNTER — Inpatient Hospital Stay: Admission: RE | Admit: 2020-11-08 | Payer: Self-pay | Source: Ambulatory Visit

## 2020-11-09 ENCOUNTER — Other Ambulatory Visit: Payer: Commercial Managed Care - PPO

## 2020-11-11 ENCOUNTER — Encounter: Admission: RE | Payer: Self-pay | Source: Home / Self Care

## 2020-11-11 ENCOUNTER — Ambulatory Visit: Admission: RE | Admit: 2020-11-11 | Payer: Commercial Managed Care - PPO | Source: Home / Self Care | Admitting: Surgery

## 2020-11-11 SURGERY — SHOULDER ARTHROSCOPY WITH SUBACROMIAL DECOMPRESSION, ROTATOR CUFF REPAIR AND BICEP TENDON REPAIR
Anesthesia: Choice | Laterality: Right

## 2020-12-07 ENCOUNTER — Other Ambulatory Visit: Payer: Self-pay | Admitting: Surgery

## 2020-12-09 ENCOUNTER — Other Ambulatory Visit: Payer: Self-pay

## 2020-12-09 ENCOUNTER — Encounter
Admission: RE | Admit: 2020-12-09 | Discharge: 2020-12-09 | Disposition: A | Discharge status: Home / Self Care | Payer: Commercial Managed Care - PPO | Source: Ambulatory Visit | Attending: Surgery | Admitting: Surgery

## 2020-12-09 DIAGNOSIS — I10 Essential (primary) hypertension: Secondary | ICD-10-CM | POA: Diagnosis not present

## 2020-12-09 DIAGNOSIS — E118 Type 2 diabetes mellitus with unspecified complications: Secondary | ICD-10-CM | POA: Diagnosis not present

## 2020-12-09 DIAGNOSIS — Z01818 Encounter for other preprocedural examination: Secondary | ICD-10-CM | POA: Insufficient documentation

## 2020-12-09 HISTORY — DX: Gastro-esophageal reflux disease without esophagitis: K21.9

## 2020-12-09 HISTORY — DX: Anemia, unspecified: D64.9

## 2020-12-09 NOTE — Patient Instructions (Addendum)
Your procedure is scheduled on:12-16-20 THURSDAY Report to the Registration Desk on the 1st floor of the [facility]-Then proceed to the 2nd floor Surgery Desk in the Medical Mall To find out your arrival time, please call [phone] between 1PM - 3PM on:12-15-20 WEDNESDAY  REMEMBER: Instructions that are not followed completely may result in serious medical risk, up to and including death; or upon the discretion of your surgeon and anesthesiologist your surgery may need to be rescheduled.  Do not eat food after midnight the night before surgery.  No gum chewing, lozengers or hard candies.  You may however, drink WATER up to 2 hours before you are scheduled to arrive for your surgery. Do not drink anything within 2 hours of your scheduled arrival time.  Type 1 and Type 2 diabetics should only drink water.  TAKE THESE MEDICATIONS THE MORNING OF SURGERY WITH A SIP OF WATER: -SYNTHROID (LEVOTHYROXINE) -CYTOMEL (LIOTYRONINE) -PRILOSEC (OMEPRAZOLE)  USE YOUR ALBUTEROL INHALER THE DAY OF SURGERY AND BRING ALBUTEROL INHALER TO THE HOSPITAL  Stop Metformin 2 days prior to surgery-LAST DOSE ON 11-15-20 MONDAY   Follow recommendations from Cardiologist, Pulmonologist or PCP regarding stopping Aspirin, Coumadin, Plavix, Eliquis, Pradaxa, or Pletal-CALL DR POGGI'S OFFICE TODAY (12-09-20) ABOUT WHEN YOU NEED TO STOP YOUR ASPIRIN  One week prior to surgery: Stop Anti-inflammatories (NSAIDS) such as Advil, Aleve, Ibuprofen, Motrin, Naproxen, Naprosyn and Aspirin based products such as Excedrin, Goodys Powder, BC Powder-OK TO TAKE TYLENOL/TRAMADOL IF NEEDED  Stop ANY OVER THE COUNTER supplements until after surgery-STOP YOUR MELATONIN NOW-OK TO CONTINUE YOUR VITAMIN D3  No Alcohol for 24 hours before or after surgery.  No Smoking including e-cigarettes for 24 hours prior to surgery.  No chewable tobacco products for at least 6 hours prior to surgery.  No nicotine patches on the day of surgery.  Do  not use any "recreational" drugs for at least a week prior to your surgery.  Please be advised that the combination of cocaine and anesthesia may have negative outcomes, up to and including death. If you test positive for cocaine, your surgery will be cancelled.  On the morning of surgery brush your teeth with toothpaste and water, you may rinse your mouth with mouthwash if you wish. Do not swallow any toothpaste or mouthwash.  Do not wear jewelry, make-up, hairpins, clips or nail polish.  Do not wear lotions, powders, or perfumes.   Do not shave body from the neck down 48 hours prior to surgery just in case you cut yourself which could leave a site for infection.  Also, freshly shaved skin may become irritated if using the CHG soap.  Contact lenses, hearing aids and dentures may not be worn into surgery.  Do not bring valuables to the hospital. [facility] is not responsible for any missing/lost belongings or valuables.   Use CHG Soap as directed on instruction sheet.  Notify your doctor if there is any change in your medical condition (cold, fever, infection).  Wear comfortable clothing (specific to your surgery type) to the hospital.  Plan for stool softeners for home use; pain medications have a tendency to cause constipation. You can also help prevent constipation by eating foods high in fiber such as fruits and vegetables and drinking plenty of fluids as your diet allows.  After surgery, you can help prevent lung complications by doing breathing exercises.  Take deep breaths and cough every 1-2 hours. Your doctor may order a device called an Incentive Spirometer to help you take  deep breaths. When coughing or sneezing, hold a pillow firmly against your incision with both hands. This is called "splinting." Doing this helps protect your incision. It also decreases belly discomfort.  If you are being admitted to the hospital overnight, leave your suitcase in the car. After surgery  it may be brought to your room.  If you are being discharged the day of surgery, you will not be allowed to drive home. You will need a responsible adult (18 years or older) to drive you home and stay with you that night.   If you are taking public transportation, you will need to have a responsible adult (18 years or older) with you. Please confirm with your physician that it is acceptable to use public transportation.   Please call the Pre-admissions Testing Dept. at [phone] if you have any questions about these instructions.  Surgery Visitation Policy:  Patients undergoing a surgery or procedure may have one family member or support person with them as long as that person is not COVID-19 positive or experiencing its symptoms.  That person may remain in the waiting area during the procedure.  Inpatient Visitation:    Visiting hours are 7 a.m. to 8 p.m. Inpatients will be allowed two visitors daily. The visitors may change each day during the patient's stay. No visitors under the age of 30. Any visitor under the age of 81 must be accompanied by an adult. The visitor must pass COVID-19 screenings, use hand sanitizer when entering and exiting the patient's room and wear a mask at all times, including in the patient's room. Patients must also wear a mask when staff or their visitor are in the room. Masking is required regardless of vaccination status.

## 2020-12-10 ENCOUNTER — Encounter
Admission: RE | Admit: 2020-12-10 | Discharge: 2020-12-10 | Disposition: A | Discharge status: Home / Self Care | Payer: Commercial Managed Care - PPO | Source: Ambulatory Visit | Attending: Surgery | Admitting: Surgery

## 2020-12-10 DIAGNOSIS — Z01812 Encounter for preprocedural laboratory examination: Secondary | ICD-10-CM | POA: Insufficient documentation

## 2020-12-10 DIAGNOSIS — Z20822 Contact with and (suspected) exposure to covid-19: Secondary | ICD-10-CM | POA: Insufficient documentation

## 2020-12-10 DIAGNOSIS — E119 Type 2 diabetes mellitus without complications: Secondary | ICD-10-CM | POA: Insufficient documentation

## 2020-12-10 DIAGNOSIS — Z01818 Encounter for other preprocedural examination: Secondary | ICD-10-CM | POA: Diagnosis not present

## 2020-12-10 DIAGNOSIS — I10 Essential (primary) hypertension: Secondary | ICD-10-CM | POA: Insufficient documentation

## 2020-12-10 LAB — BASIC METABOLIC PANEL
Anion gap: 8 (ref 5–15)
BUN: 21 mg/dL — ABNORMAL HIGH (ref 6–20)
CO2: 25 mmol/L (ref 22–32)
Calcium: 8.6 mg/dL — ABNORMAL LOW (ref 8.9–10.3)
Chloride: 106 mmol/L (ref 98–111)
Creatinine, Ser: 0.45 mg/dL (ref 0.44–1.00)
GFR, Estimated: >60 mL/min (ref >60)
Glucose, Bld: 89 mg/dL (ref 70–99)
Potassium: 3.8 mmol/L (ref 3.5–5.1)
Sodium: 139 mmol/L (ref 135–145)

## 2020-12-14 ENCOUNTER — Other Ambulatory Visit: Payer: Self-pay

## 2020-12-14 ENCOUNTER — Other Ambulatory Visit
Admission: RE | Admit: 2020-12-14 | Discharge: 2020-12-14 | Disposition: A | Discharge status: Home / Self Care | Payer: Commercial Managed Care - PPO | Source: Ambulatory Visit | Attending: Surgery | Admitting: Surgery

## 2020-12-14 DIAGNOSIS — Z20822 Contact with and (suspected) exposure to covid-19: Secondary | ICD-10-CM | POA: Diagnosis not present

## 2020-12-14 DIAGNOSIS — Z01812 Encounter for preprocedural laboratory examination: Secondary | ICD-10-CM | POA: Insufficient documentation

## 2020-12-15 LAB — SARS CORONAVIRUS 2 (TAT 6-24 HRS): SARS Coronavirus 2: NEGATIVE

## 2020-12-16 ENCOUNTER — Other Ambulatory Visit: Payer: Self-pay

## 2020-12-16 ENCOUNTER — Encounter
Admission: RE | Disposition: A | Discharge status: Home / Self Care | Payer: Self-pay | Source: Home / Self Care | Attending: Surgery

## 2020-12-16 ENCOUNTER — Ambulatory Visit: Payer: Commercial Managed Care - PPO

## 2020-12-16 ENCOUNTER — Ambulatory Visit: Payer: Commercial Managed Care - PPO | Admitting: Certified Registered Nurse Anesthetist

## 2020-12-16 ENCOUNTER — Ambulatory Visit
Admission: RE | Admit: 2020-12-16 | Discharge: 2020-12-16 | Disposition: A | Discharge status: Home / Self Care | Payer: Commercial Managed Care - PPO | Attending: Surgery | Admitting: Surgery

## 2020-12-16 ENCOUNTER — Encounter: Payer: Self-pay | Admitting: Surgery

## 2020-12-16 DIAGNOSIS — S46011A Strain of muscle(s) and tendon(s) of the rotator cuff of right shoulder, initial encounter: Secondary | ICD-10-CM | POA: Insufficient documentation

## 2020-12-16 DIAGNOSIS — Z885 Allergy status to narcotic agent status: Secondary | ICD-10-CM | POA: Diagnosis not present

## 2020-12-16 DIAGNOSIS — Z7951 Long term (current) use of inhaled steroids: Secondary | ICD-10-CM | POA: Insufficient documentation

## 2020-12-16 DIAGNOSIS — M659 Synovitis and tenosynovitis, unspecified: Secondary | ICD-10-CM | POA: Insufficient documentation

## 2020-12-16 DIAGNOSIS — Z7982 Long term (current) use of aspirin: Secondary | ICD-10-CM | POA: Diagnosis not present

## 2020-12-16 DIAGNOSIS — Z86718 Personal history of other venous thrombosis and embolism: Secondary | ICD-10-CM | POA: Insufficient documentation

## 2020-12-16 DIAGNOSIS — Z888 Allergy status to other drugs, medicaments and biological substances status: Secondary | ICD-10-CM | POA: Insufficient documentation

## 2020-12-16 DIAGNOSIS — Z79899 Other long term (current) drug therapy: Secondary | ICD-10-CM | POA: Diagnosis not present

## 2020-12-16 DIAGNOSIS — Z7984 Long term (current) use of oral hypoglycemic drugs: Secondary | ICD-10-CM | POA: Insufficient documentation

## 2020-12-16 DIAGNOSIS — X509XXA Other and unspecified overexertion or strenuous movements or postures, initial encounter: Secondary | ICD-10-CM | POA: Diagnosis not present

## 2020-12-16 DIAGNOSIS — S43431A Superior glenoid labrum lesion of right shoulder, initial encounter: Secondary | ICD-10-CM | POA: Insufficient documentation

## 2020-12-16 DIAGNOSIS — Z886 Allergy status to analgesic agent status: Secondary | ICD-10-CM | POA: Diagnosis not present

## 2020-12-16 DIAGNOSIS — Z01818 Encounter for other preprocedural examination: Secondary | ICD-10-CM

## 2020-12-16 HISTORY — PX: SHOULDER ARTHROSCOPY WITH BICEPS TENDON REPAIR: SHX5674

## 2020-12-16 LAB — GLUCOSE, CAPILLARY
Glucose-Capillary: 115 mg/dL — ABNORMAL HIGH (ref 70–99)
Glucose-Capillary: 115 mg/dL — ABNORMAL HIGH (ref 70–99)

## 2020-12-16 LAB — POCT PREGNANCY, URINE: Preg Test, Ur: NEGATIVE

## 2020-12-16 SURGERY — SHOULDER ARTHROSCOPY WITH BICEPS TENDON REPAIR
Anesthesia: General | Site: Shoulder | Laterality: Right

## 2020-12-16 MED ORDER — FENTANYL CITRATE (PF) 100 MCG/2ML IJ SOLN
INTRAMUSCULAR | Status: DC | PRN
  Administered 2020-12-16: 25 ug via INTRAVENOUS
  Administered 2020-12-16: 50 ug via INTRAVENOUS
  Administered 2020-12-16: 25 ug via INTRAVENOUS

## 2020-12-16 MED ORDER — GLYCOPYRROLATE 0.2 MG/ML IJ SOLN
INTRAMUSCULAR | Status: DC | PRN
  Administered 2020-12-16: .2 mg via INTRAVENOUS

## 2020-12-16 MED ORDER — DEXAMETHASONE SODIUM PHOSPHATE 10 MG/ML IJ SOLN
INTRAMUSCULAR | Status: DC | PRN
  Administered 2020-12-16 (×2): 10 mg via INTRAVENOUS

## 2020-12-16 MED ORDER — ALBUTEROL SULFATE HFA 108 (90 BASE) MCG/ACT IN AERS
INHALATION_SPRAY | RESPIRATORY_TRACT | Status: AC
  Filled 2020-12-16: qty 6.7

## 2020-12-16 MED ORDER — BUPIVACAINE LIPOSOME 1.3 % IJ SUSP
INTRAMUSCULAR | Status: DC | PRN
  Administered 2020-12-16: 20 mL

## 2020-12-16 MED ORDER — FENTANYL CITRATE (PF) 100 MCG/2ML IJ SOLN
25.0000 ug | INTRAMUSCULAR | Status: DC | PRN
Start: 2020-12-16 — End: 2020-12-16

## 2020-12-16 MED ORDER — ROCURONIUM BROMIDE 100 MG/10ML IV SOLN
INTRAVENOUS | Status: DC | PRN
  Administered 2020-12-16: 10 mg via INTRAVENOUS
  Administered 2020-12-16: 60 mg via INTRAVENOUS

## 2020-12-16 MED ORDER — BUPIVACAINE LIPOSOME 1.3 % IJ SUSP
INTRAMUSCULAR | Status: AC
  Filled 2020-12-16: qty 20

## 2020-12-16 MED ORDER — CEFAZOLIN SODIUM-DEXTROSE 2-4 GM/100ML-% IV SOLN
2.0000 g | INTRAVENOUS | Status: AC
  Administered 2020-12-16: 2 g via INTRAVENOUS

## 2020-12-16 MED ORDER — DEXAMETHASONE SODIUM PHOSPHATE 10 MG/ML IJ SOLN
INTRAMUSCULAR | Status: AC
  Filled 2020-12-16: qty 1

## 2020-12-16 MED ORDER — SUGAMMADEX SODIUM 200 MG/2ML IV SOLN
INTRAVENOUS | Status: DC | PRN
  Administered 2020-12-16: 200 mg via INTRAVENOUS

## 2020-12-16 MED ORDER — EPINEPHRINE PF 1 MG/ML IJ SOLN
INTRAMUSCULAR | Status: AC
  Filled 2020-12-16: qty 2

## 2020-12-16 MED ORDER — OXYCODONE HCL 5 MG PO TABS: 5.0000 mg | ORAL_TABLET | ORAL | 0 refills | Status: DC | PRN

## 2020-12-16 MED ORDER — ROCURONIUM BROMIDE 10 MG/ML (PF) SYRINGE
PREFILLED_SYRINGE | INTRAVENOUS | Status: AC
  Filled 2020-12-16: qty 10

## 2020-12-16 MED ORDER — SODIUM CHLORIDE 0.9 % IV SOLN
INTRAVENOUS | Status: DC | PRN
  Administered 2020-12-16: 30 ug/min via INTRAVENOUS

## 2020-12-16 MED ORDER — ONDANSETRON HCL 4 MG/2ML IJ SOLN: 4.0000 mg | Freq: Once | INTRAMUSCULAR | Status: AC | PRN

## 2020-12-16 MED ORDER — LIDOCAINE HCL (PF) 1 % IJ SOLN
INTRAMUSCULAR | Status: AC
  Filled 2020-12-16: qty 5

## 2020-12-16 MED ORDER — PHENYLEPHRINE HCL (PRESSORS) 10 MG/ML IV SOLN
INTRAVENOUS | Status: DC | PRN
  Administered 2020-12-16 (×2): 100 ug via INTRAVENOUS

## 2020-12-16 MED ORDER — CHLORHEXIDINE GLUCONATE 0.12 % MT SOLN
OROMUCOSAL | Status: AC
  Administered 2020-12-16: 15 mL via OROMUCOSAL
  Filled 2020-12-16: qty 15

## 2020-12-16 MED ORDER — PHENYLEPHRINE HCL (PRESSORS) 10 MG/ML IV SOLN
INTRAVENOUS | Status: AC
  Filled 2020-12-16: qty 1

## 2020-12-16 MED ORDER — MIDAZOLAM HCL 2 MG/2ML IJ SOLN: 2.0000 mg | Freq: Once | INTRAMUSCULAR | Status: AC

## 2020-12-16 MED ORDER — FENTANYL CITRATE (PF) 100 MCG/2ML IJ SOLN: 100.0000 ug | Freq: Once | INTRAMUSCULAR | Status: AC

## 2020-12-16 MED ORDER — BUPIVACAINE-EPINEPHRINE 0.5% -1:200000 IJ SOLN
INTRAMUSCULAR | Status: DC | PRN
  Administered 2020-12-16: 30 mL

## 2020-12-16 MED ORDER — LIDOCAINE HCL (CARDIAC) PF 100 MG/5ML IV SOSY
PREFILLED_SYRINGE | INTRAVENOUS | Status: DC | PRN
  Administered 2020-12-16: 100 mg via INTRAVENOUS

## 2020-12-16 MED ORDER — DEXMEDETOMIDINE (PRECEDEX) IN NS 20 MCG/5ML (4 MCG/ML) IV SYRINGE
PREFILLED_SYRINGE | INTRAVENOUS | Status: DC | PRN
  Administered 2020-12-16: 4 ug via INTRAVENOUS

## 2020-12-16 MED ORDER — ACETAMINOPHEN 10 MG/ML IV SOLN
INTRAVENOUS | Status: AC
  Filled 2020-12-16: qty 100

## 2020-12-16 MED ORDER — ACETAMINOPHEN 10 MG/ML IV SOLN
INTRAVENOUS | Status: DC | PRN
  Administered 2020-12-16: 1000 mg via INTRAVENOUS

## 2020-12-16 MED ORDER — MIDAZOLAM HCL 2 MG/2ML IJ SOLN
INTRAMUSCULAR | Status: AC
  Administered 2020-12-16: 2 mg via INTRAVENOUS
  Filled 2020-12-16: qty 2

## 2020-12-16 MED ORDER — BUPIVACAINE HCL (PF) 0.5 % IJ SOLN
INTRAMUSCULAR | Status: DC | PRN
  Administered 2020-12-16: 10 mL

## 2020-12-16 MED ORDER — ORAL CARE MOUTH RINSE: 15.0000 mL | Freq: Once | OROMUCOSAL | Status: AC

## 2020-12-16 MED ORDER — SODIUM CHLORIDE 0.9 % IV SOLN: INTRAVENOUS | Status: DC

## 2020-12-16 MED ORDER — CHLORHEXIDINE GLUCONATE 0.12 % MT SOLN: 15.0000 mL | Freq: Once | OROMUCOSAL | Status: AC

## 2020-12-16 MED ORDER — PROPOFOL 10 MG/ML IV BOLUS
INTRAVENOUS | Status: DC | PRN
  Administered 2020-12-16: 50 mg via INTRAVENOUS
  Administered 2020-12-16: 150 mg via INTRAVENOUS

## 2020-12-16 MED ORDER — ONDANSETRON HCL 4 MG/2ML IJ SOLN
INTRAMUSCULAR | Status: AC
  Administered 2020-12-16: 4 mg via INTRAVENOUS
  Filled 2020-12-16: qty 2

## 2020-12-16 MED ORDER — ONDANSETRON HCL 4 MG/2ML IJ SOLN
INTRAMUSCULAR | Status: DC | PRN
  Administered 2020-12-16: 4 mg via INTRAVENOUS

## 2020-12-16 MED ORDER — BUPIVACAINE HCL (PF) 0.5 % IJ SOLN
INTRAMUSCULAR | Status: AC
  Filled 2020-12-16: qty 10

## 2020-12-16 MED ORDER — EPHEDRINE 5 MG/ML INJ
INTRAVENOUS | Status: AC
  Filled 2020-12-16: qty 10

## 2020-12-16 MED ORDER — FENTANYL CITRATE (PF) 100 MCG/2ML IJ SOLN
INTRAMUSCULAR | Status: AC
  Administered 2020-12-16: 100 ug via INTRAVENOUS
  Filled 2020-12-16: qty 2

## 2020-12-16 MED ORDER — EPHEDRINE SULFATE 50 MG/ML IJ SOLN
INTRAMUSCULAR | Status: DC | PRN
  Administered 2020-12-16: 5 mg via INTRAVENOUS

## 2020-12-16 MED ORDER — BUPIVACAINE HCL (PF) 0.5 % IJ SOLN
INTRAMUSCULAR | Status: AC
  Filled 2020-12-16: qty 30

## 2020-12-16 MED ORDER — CEFAZOLIN SODIUM-DEXTROSE 2-4 GM/100ML-% IV SOLN
INTRAVENOUS | Status: AC
  Filled 2020-12-16: qty 100

## 2020-12-16 MED ORDER — FENTANYL CITRATE (PF) 100 MCG/2ML IJ SOLN
INTRAMUSCULAR | Status: AC
  Filled 2020-12-16: qty 2

## 2020-12-16 SURGICAL SUPPLY — 47 items
ANCH SUT 2 2.9 2 LD TPR NDL (Anchor) ×1 IMPLANT
ANCHOR JUGGERKNOT WTAP NDL 2.9 (Anchor) ×1 IMPLANT
APL PRP STRL LF DISP 70% ISPRP (MISCELLANEOUS) ×1
BIT DRILL JUGRKNT W/NDL BIT2.9 (DRILL) IMPLANT
BLADE FULL RADIUS 3.5 (BLADE) ×2 IMPLANT
BUR ACROMIONIZER 4.0 (BURR) ×2 IMPLANT
CANNULA SHAVER 8MMX76MM (CANNULA) ×2 IMPLANT
CHLORAPREP W/TINT 26 (MISCELLANEOUS) ×2 IMPLANT
COVER MAYO STAND REUSABLE (DRAPES) ×2 IMPLANT
COVER WAND RF STERILE (DRAPES) ×2 IMPLANT
DRAPE IMP U-DRAPE 54X76 (DRAPES) ×4 IMPLANT
DRILL JUGGERKNOT W/NDL BIT 2.9 (DRILL) ×2
ELECT CAUTERY BLADE 6.4 (BLADE) ×2 IMPLANT
ELECT REM PT RETURN 9FT ADLT (ELECTROSURGICAL) ×2
ELECTRODE REM PT RTRN 9FT ADLT (ELECTROSURGICAL) ×1 IMPLANT
GAUZE SPONGE 4X4 12PLY STRL (GAUZE/BANDAGES/DRESSINGS) ×2 IMPLANT
GAUZE XEROFORM 1X8 LF (GAUZE/BANDAGES/DRESSINGS) ×2 IMPLANT
GLOVE INDICATOR 8.0 STRL GRN (GLOVE) ×2 IMPLANT
GLOVE SRG 8 PF TXTR STRL LF DI (GLOVE) ×1 IMPLANT
GLOVE SURG ENC MOIS LTX SZ7.5 (GLOVE) ×4 IMPLANT
GLOVE SURG ENC MOIS LTX SZ8 (GLOVE) ×4 IMPLANT
GLOVE SURG UNDER POLY LF SZ8 (GLOVE) ×2
GOWN STRL REUS W/ TWL LRG LVL3 (GOWN DISPOSABLE) ×1 IMPLANT
GOWN STRL REUS W/ TWL XL LVL3 (GOWN DISPOSABLE) ×1 IMPLANT
GOWN STRL REUS W/TWL LRG LVL3 (GOWN DISPOSABLE) ×2
GOWN STRL REUS W/TWL XL LVL3 (GOWN DISPOSABLE) ×2
GRASPER SUT 15 45D LOW PRO (SUTURE) IMPLANT
IV LACTATED RINGER IRRG 3000ML (IV SOLUTION) ×2
IV LR IRRIG 3000ML ARTHROMATIC (IV SOLUTION) ×2 IMPLANT
KIT CANNULA 8X76-LX IN CANNULA (CANNULA) IMPLANT
MANIFOLD NEPTUNE II (INSTRUMENTS) ×4 IMPLANT
MASK FACE SPIDER DISP (MASK) ×2 IMPLANT
MAT ABSORB  FLUID 56X50 GRAY (MISCELLANEOUS) ×2
MAT ABSORB FLUID 56X50 GRAY (MISCELLANEOUS) ×1 IMPLANT
PACK ARTHROSCOPY SHOULDER (MISCELLANEOUS) ×2 IMPLANT
PASSER SUT FIRSTPASS SELF (INSTRUMENTS) IMPLANT
SLING ARM LRG DEEP (SOFTGOODS) ×2 IMPLANT
SLING ULTRA II LG (MISCELLANEOUS) ×2 IMPLANT
STAPLER SKIN PROX 35W (STAPLE) ×2 IMPLANT
STRAP SAFETY 5IN WIDE (MISCELLANEOUS) ×2 IMPLANT
SUT ETHIBOND 0 MO6 C/R (SUTURE) ×2 IMPLANT
SUT ULTRABRAID 2 COBRAID 38 (SUTURE) IMPLANT
SUT VIC AB 2-0 CT1 27 (SUTURE) ×4
SUT VIC AB 2-0 CT1 TAPERPNT 27 (SUTURE) ×2 IMPLANT
TAPE MICROFOAM 4IN (TAPE) ×2 IMPLANT
TUBING ARTHRO INFLOW-ONLY STRL (TUBING) ×2 IMPLANT
WAND WEREWOLF FLOW 90D (MISCELLANEOUS) ×2 IMPLANT

## 2020-12-16 NOTE — Anesthesia Procedure Notes (Signed)
Procedure Name: Intubation Date/Time: 12/16/2020 2:47 PM Performed by: Joanette Gula, Pepper Wyndham, CRNA Pre-anesthesia Checklist: Patient identified, Emergency Drugs available, Suction available and Patient being monitored Patient Re-evaluated:Patient Re-evaluated prior to induction Oxygen Delivery Method: Circle system utilized Preoxygenation: Pre-oxygenation with 100% oxygen Induction Type: IV induction Ventilation: Mask ventilation without difficulty and Oral airway inserted - appropriate to patient size Laryngoscope Size: McGraph and 3 Grade View: Grade III Tube type: Oral Tube size: 7.0 mm Number of attempts: 2 Airway Equipment and Method: Stylet,  Oral airway and Bougie stylet Placement Confirmation: ETT inserted through vocal cords under direct vision,  positive ETCO2 and breath sounds checked- equal and bilateral Secured at: 20 cm Tube secured with: Tape Dental Injury: Teeth and Oropharynx as per pre-operative assessment  Difficulty Due To: Difficulty was unanticipated and Difficult Airway- due to anterior larynx Comments: Attempted intubation with McGraph 3 was only able to attain grade 3 view. Masked pt too 100% then used bougie to assist intubation.

## 2020-12-16 NOTE — Discharge Instructions (Signed)
Interscalene Nerve Block with Exparel  1.  For your surgery you have received an Interscalene Nerve Block with Exparel. 2. Nerve Blocks affect many types of nerves, including nerves that control movement, pain and normal sensation.  You may experience feelings such as numbness, tingling, heaviness, weakness or the inability to move your arm or the feeling or sensation that your arm has "fallen asleep". 3. A nerve block with Exparel can last up to 5 days.  Usually the weakness wears off first.  The tingling and heaviness usually wear off next.  Finally you may start to notice pain.  Keep in mind that this may occur in any order.  Once a nerve block starts to wear off it is usually completely gone within 60 minutes. 4. ISNB may cause mild shortness of breath, a hoarse voice, blurry vision, unequal pupils, or drooping of the face on the same side as the nerve block.  These symptoms will usually resolve with the numbness.  Very rarely the procedure itself can cause mild seizures. 5. If needed, your surgeon will give you a prescription for pain medication.  It will take about 60 minutes for the oral pain medication to become fully effective.  So, it is recommended that you start taking this medication before the nerve block first begins to wear off, or when you first begin to feel discomfort. 6. Take your pain medication only as prescribed.  Pain medication can cause sedation and decrease your breathing if you take more than you need for the level of pain that you have. 7. Nausea is a common side effect of many pain medications.  You may want to eat something before taking your pain medicine to prevent nausea. 8. After an Interscalene nerve block, you cannot feel pain, pressure or extremes in temperature in the effected arm.  Because your arm is numb it is at an increased risk for injury.  To decrease the possibility of injury, please practice the following:  a. While you are awake change the position of  your arm frequently to prevent too much pressure on any one area for prolonged periods of time. b.  If you have a cast or tight dressing, check the color or your fingers every couple of hours.  Call your surgeon with the appearance of any discoloration (white or blue). c. If you are given a sling to wear before you go home, please wear it  at all times until the block has completely worn off.  Do not get up at night without your sling. d. Please contact [facility] or your surgeon if you do not begin to regain sensation after 7 days from the surgery.  Anesthesia may be contacted by calling the Same Day Surgery Department, Mon. through Fri., 6 am to 4 pm at [phone].   e. If you experience any other problems or concerns, please contact your surgeon's office. If you experience severe or prolonged shortness of breath go to the nearest emergency department.AMBULATORY SURGERY  DISCHARGE INSTRUCTIONS  1) The drugs that you were given will stay in your system until tomorrow so for the next 24 hours you should not: A) Drive an automobile B) Make any legal decisions C) Drink any alcoholic beverage  2) You may resume regular meals tomorrow.  Today it is better to start with liquids and gradually work up to solid foods. You may eat anything you prefer, but it is better to start with liquids, then soup and crackers, and gradually work up to solid  foods.  3) Please notify your doctor immediately if you have any unusual bleeding, trouble breathing, redness and pain at the surgery site, drainage, fever, or pain not relieved by medication.  4) Additional Instructions:  Please contact your physician with any problems or Same Day Surgery at [phone], Monday through Friday 6 am to 4 pm, or [place] at [facility] number at [phone].   Orthopedic discharge instructions: Keep dressing dry and intact.  May shower after dressing changed on post-op day #4 (Monday).  Cover staples with  Band-Aids after drying off. Apply ice frequently to shoulder. Take Aleve 2 tabs BID OR ibuprofen 600 mg TID with meals for 3-5 days, then as necessary. Take oxycodone as prescribed when needed.  May supplement with ES Tylenol if necessary. Keep shoulder immobilizer on at all times except may remove for bathing purposes. Follow-up in 10-14 days or as scheduled.

## 2020-12-16 NOTE — Transfer of Care (Signed)
Immediate Anesthesia Transfer of Care Note  Patient: [name]  Procedure(s) Performed: SHOULDER ARTHROSCOPY WITH DEBRIDEMENT, DECOMPRESSION, POSSIBLE ROTATOR CUFF REPAIR AND MINI BICEPS TENODESIS (Right Shoulder)  Patient Location: PACU  Anesthesia Type:General  Level of Consciousness: drowsy  Airway & Oxygen Therapy: Patient Spontanous Breathing and Patient connected to face mask oxygen  Post-op Assessment: Report given to RN and Post -op Vital signs reviewed and stable  Post vital signs: Reviewed and stable  Last Vitals:  Vitals Value Taken Time  BP 130/76 12/16/20 1545  Temp    Pulse 85 12/16/20 1549  Resp 16 12/16/20 1549  SpO2 100 % 12/16/20 1549  Vitals shown include unvalidated device data.  Last Pain:  Vitals:   12/16/20 1044  TempSrc: Oral  PainSc: 2          Complications: No complications documented.

## 2020-12-16 NOTE — H&P (Signed)
History of Present Illness: [name] is a 45 y.o. who presents today for a history and physical. She is to undergo rotator cuff repair of the right shoulder on 12/16/2020. Was last seen in the clinic on 10/07/2020. There have been no change in her condition since that time.  The patient was lifting boxes when she felt a pain develop in her right shoulder on 08/05/2020. She denies feeling a pop, after this occurred she began to experience pain along the lateral aspect of the arm and posterior aspect of the shoulder. The pain has continued to worsen, during the day she ranks her pain is a 5 out of 10 however at night he can reach a severe intensity. She describes the pain as aching, constant throbbing pain during the day but as the day goes on she will experience a burning sensation of the right arm. At her initial evaluation the patient did report that she would experience occasional tingling in the right hand however this was at the end of the day. The patient denies any neck pain or trauma to the cervical spine. Because her exam was not consistent with either a rotator cuff tendinopathy or cervical radiculopathy the patient was given a prednisone taper. She was also given exercises to perform at home in addition to a formal physical therapy referral. The initial prednisone taper did not resolve any of her discomfort in the right arm. She continued to have pain primarily on the lateral aspect of the shoulder which she describes a constant ache with occasional burning quality. She was offered and received a right subacromial steroid injection which did not provide any significant relief, she states that this in fact made the pain worse in the shoulder. Because of continued pain and discomfort she was sent for an MRI scan of the right humerus in addition to shoulder. The patient presents today to discuss result, pain score in the right shoulder is a 5 out of 10 at today's visit. She does still have  moderate discomfort along the lateral aspect of the shoulder. She denies any numbness or tingling radiating down the right upper extremity, she denies any neck pain at today's visit. She is reporting some increased discomfort with range of motion but she does still have excellent range of motion and strength. She denies any personal history of heart attack or stroke. No history of asthma or COPD. The patient does have a history of a DVT after suffering a knee injury in 2019. The patient did undergo a IVC filter placement in addition to subsequent filter removal. She denies any history of DVT since the initial insult.   Patient states that her pain is increased to the point is significantly interfering with her activities of daily living and wishes to proceed with surgery.  Past Medical History: . Acquired hypothyroidism, unspecified 02/08/2015  . Anemia 02/08/2015  Mild post bariatric surgery  . Asthma without status asthmaticus, unspecified  . DM II (diabetes mellitus, type II), controlled (CMS-HCC) 02/08/2015  . Essential hypertension 02/08/2015  . History of abnormal cervical Pap smear 03/2018  LSIL HPV +  . Hx of obesity 02/08/2015  Improved post bariatric surgery  . Hypertension  . Hypothyroid, unspecified  . Multinodular goiter  . Pure hypercholesterolemia 02/08/2015  . Type 2 diabetes mellitus (CMS-HCC)   Past Surgical History . ARTHROSCOPY KNEE W/MENISCUS REPAIR Right  . CESAREAN SECTION  x4  . COLPOSCOPY 04/09/2018  abnormal pap 6/19 LSIL HPV+  . TUBAL LIGATION   Past  Family History: . Diabetes Mother  . Hyperlipidemia (Elevated cholesterol) Mother  . Depression Mother  . Diabetes Father  . High blood pressure (Hypertension) Father  . Mitral valve prolapse Father  and replacement  . Ovarian cancer Sister 78  . Breast cancer Neg Hx  . Cervical cancer Neg Hx  . Colon cancer Neg Hx   Medications: . acetaminophen (TYLENOL) 500 MG tablet Take 1 tablet by mouth every 6 (six) minutes  as needed  . aspirin 81 MG EC tablet Take 81 mg by mouth once daily.  . budesonide-formoteroL (SYMBICORT) 80-4.5 mcg/actuation inhaler Inhale 1 inhalation into the lungs once daily  . cholecalciferol (VITAMIN D3) 2,000 unit capsule Take 2,000 Units by mouth once daily.  Marland Kitchen levothyroxine (SYNTHROID, LEVOTHROID) 88 MCG tablet Take 1 tablet (88 mcg total) by mouth once daily Take on an empty stomach with a glass of water at least 30-60 minutes before breakfast. 90 tablet 3  . liothyronine (CYTOMEL) 5 MCG tablet Take 1 tablet (5 mcg total) by mouth once daily Take on an empty stomach with a glass of water at least 30-60 minutes before breakfast. (Patient taking differently: Take 10 mcg by mouth 2 (two) times daily Take on an empty stomach with a glass of water at least 30-60 minutes before breakfast. ) 90 tablet 3  . losartan (COZAAR) 50 MG tablet Take 1 tablet (50 mg total) by mouth once daily 30 tablet 0  . metFORMIN (GLUCOPHAGE) 1000 MG tablet Take 1 tablet (1,000 mg total) by mouth 2 (two) times daily with meals 180 tablet 1  . omeprazole (PRILOSEC) 20 MG DR capsule Take 20 mg by mouth once daily  . pravastatin (PRAVACHOL) 20 MG tablet Take 1 tablet (20 mg total) by mouth nightly 90 tablet 3  . tiZANidine (ZANAFLEX) 4 MG tablet Take 1 tablet by mouth twice daily as needed 30 tablet 0  . traMADoL (ULTRAM) 50 mg tablet Take 1 tablet by mouth twice daily as needed for pain 30 tablet 0  . traZODone (DESYREL) 50 MG tablet Take 1-2 tablets (50-100 mg total) by mouth nightly as needed for Sleep 180 tablet 1  . VENTOLIN HFA 90 mcg/actuation inhaler INHALE 2 PUFFS BY MOUTH EVERY 6 HOURS AS NEEDED FOR WHEEZING 18 g 0  . fluticasone furoate-vilanteroL (BREO ELLIPTA) 100-25 mcg/dose DsDv inhaler Inhale 1 inhalation into the lungs once daily (Patient not taking: Reported on 12/08/2020 ) 1 Inhaler 11  . gabapentin (NEURONTIN) 100 MG capsule Take 1-2 capsules (100-200 mg total) by mouth nightly (Patient not taking:  Reported on 12/08/2020 ) 30 capsule 1  . meloxicam (MOBIC) 15 MG tablet Take 1 tablet (15 mg total) by mouth once daily (Patient not taking: Reported on 12/08/2020 ) 30 tablet 0   Allergies: . Shellfish Containing Products Swelling  Tongue swells No problems with betadine on skin  . Nsaids (Non-Steroidal Anti-Inflammatory Drug) Other (See Comments)  CAUSES ELEVATED BLOOD PRESSURES at high doses  . Tolmetin Unknown  CAUSES ELEVATED BLOOD PRESSURES . Tramadol Itching  . Statins-Hmg-Coa Reductase Inhibitors Muscle Pain   Review of Systems: A comprehensive 14 point ROS was performed, reviewed, and the pertinent orthopaedic findings are documented in the HPI.  Physical Exam: BP 130/80  Ht 157.5 cm (5\' 2" )  Wt 91.5 kg (201 lb 12.8 oz)  BMI 36.91 kg/m   General: Well-developed well-nourished female seen in no acute distress.   HEENT: Atraumatic,normocephalic. Pupils are equal and reactive to light. Oropharynx is clear with moist mucosa  Lungs:  Clear to auscultation bilaterally   Cardiovascular: Regular rate and rhythm. Normal S1, S2. Significant grade 2/6 murmurs. Benign. Has been worked up. No appreciable gallops or rubs. Peripheral pulses are palpable.  Abdomen: Soft, non-tender, nondistended. Bowel sounds present  Extremity: Right shoulder: The patient has full right shoulder range of motion with forward flexion, abduction in addition to internal/external rotation. With the arm abducted 90 degrees she can tolerate external rotation 90 degrees, internal rotation 70 degrees. Negative drop arm test. Negative open can test. Negative speeds test. She is tender to palpation appropriately posterior aspect of the right acromion, denies any tenderness to palpation throughout the right shoulder blade. She denies any pain with palpation to the right subacromial space or along the anterior aspect of the right shoulder.  Strength: (Side Biceps Triceps Deltoid Interossei Grip Wrist Ext. Wrist  Flex.)  R: 5 5 5 5 5 5 5   L: 5 5 5 5 5 5 5    Neurological: The patient is alert and oriented Sensation to light touch appears to be intact and within normal limits Gross motor strength appeared to be equal to 5/5 Vascular : Peripheral pulses felt to be palpable. Capillary refill appears to be intact and within normal limits  X-ray: True AP, Y-scapular, and axillary views of the right shoulder are obtained. These films demonstrate no evidence for fractures, lytic lesions, or significant degenerative changes. The subacromial space is well-maintained. There is no subacromial or infra-clavicular spurring. She demonstrates a Type I acromion.  MRI Imaging: 1. Moderate-marked rotator cuff tendinosis with low-grade  partial-thickness tears of the supraspinatus and infraspinatus  tendons, as described above. No full-thickness rotator cuff tear.  2. Marked intra-articular biceps tendinosis with mild tenosynovitis.  3. Findings which can be seen in the clinical setting of adhesive  capsulitis.  4. Mild AC joint arthropathy.   Impression: 1. Impingement/tendinopathy with partial-thickness rotator cuff tear, right shoulder. 2. Biceps tendinopathy, right shoulder.  Plan:  The treatment options, including both surgical and nonsurgical choices, have been discussed in detail with the patient. The patient would like to proceed with surgical intervention to include a right shoulder arthroscopy with debridement, decompression, possible rotator cuff repair, and biceps tenodesis. The risks (including bleeding, infection, nerve and/or blood vessel injury, persistent or recurrent pain, decreased range of motion and/or strength, failure of the repair, recurrence of the tear, need for further surgery, blood clots, strokes, heart attacks or arrhythmias, pneumonia, etc.) and benefits of the surgical procedure were discussed. The patient states her understanding and agrees to proceed. A formal written consent will  be obtained by the nursing staff.   H&P reviewed and patient re-examined. No changes.

## 2020-12-16 NOTE — Anesthesia Procedure Notes (Signed)
Anesthesia Regional Block: Interscalene brachial plexus block   Pre-Anesthetic Checklist: ,, timeout performed, Correct Patient, Correct Site, Correct Laterality, Correct Procedure, Correct Position, site marked, Risks and benefits discussed,  Surgical consent,  Pre-op evaluation,  At surgeon's request and post-op pain management  Laterality: Right  Prep: chloraprep, alcohol swabs       Needles:  Injection technique: Single-shot  Needle Type: Stimiplex     Needle Length: 5cm  Needle Gauge: 22     Additional Needles:   Procedures:, nerve stimulator,,, ultrasound used (permanent image in chart),,,,   Nerve Stimulator or Paresthesia:  Response: biceps flexion, 0.6 mA,   Additional Responses:   Narrative:  Start time: 12/16/2020 12:26 PM End time: 12/16/2020 12:29 PM Injection made incrementally with aspirations every 5 mL.  Performed by: Personally  Anesthesiologist: Yves Dill, MD  Additional Notes: Time out called.  A roll was placed under the right side of the back and the head was turned to the contralateral side.  A scout [place] film was made and the target nerves were IDed and a line drawn lateral to the probe at C6 level.  The area was prepped and a sin wheal was made along the line drawn.  Sterile technique was used and again the [place] identified the target nerve bundle in the stoplight configuration.  A 22G stimuplex needle was advanced just lateral to the bundle which elicited a biceps twitch which started to fade at 0.6 mAMPs.  Incremental easy injection with negative aspirations. Good spread around the nerves and the patient tolerated the procedure well.

## 2020-12-16 NOTE — Anesthesia Preprocedure Evaluation (Addendum)
Anesthesia Evaluation  Patient identified by MRN, date of birth, ID band Patient awake    Reviewed: Allergy & Precautions, H&P , NPO status , Patient's Chart, lab work & pertinent test results, reviewed documented beta blocker date and time   History of Anesthesia Complications Negative for: history of anesthetic complications  Airway Mallampati: III  TM Distance: >3 FB Neck ROM: full    Dental  (+) Dental Advidsory Given, Teeth Intact   Pulmonary neg shortness of breath, asthma , neg sleep apnea, neg COPD, neg recent URI,           Cardiovascular Exercise Tolerance: Good hypertension, (-) angina+ DVT  (-) CAD, (-) Past MI, (-) Cardiac Stents and (-) CABG (-) dysrhythmias + Valvular Problems/Murmurs      Neuro/Psych negative neurological ROS  negative psych ROS   GI/Hepatic negative GI ROS, Neg liver ROS,   Endo/Other  diabetesHypothyroidism Morbid obesity  Renal/GU negative Renal ROS  negative genitourinary   Musculoskeletal   Abdominal   Peds  Hematology negative hematology ROS (+) anemia ,   Anesthesia Other Findings Past Medical History: No date: Asthma No date: Diabetes mellitus without complication (HCC)     Comment:  weight loss s/p gastric sleeve. just monitored 10/2017: DVT (deep venous thrombosis) (HCC)     Comment:  right leg...to get an ivc filter prior to arthroscopy No date: Heart murmur No date: Hypertension No date: Hypothyroidism   Reproductive/Obstetrics negative OB ROS                             Anesthesia Physical  Anesthesia Plan  ASA: III  Anesthesia Plan: General   Post-op Pain Management:  Regional for Post-op pain   Induction: Intravenous  PONV Risk Score and Plan: 3 and Ondansetron and Dexamethasone  Airway Management Planned: Oral ETT  Additional Equipment:   Intra-op Plan:   Post-operative Plan: Extubation in OR  Informed Consent: I have  reviewed the patients History and Physical, chart, labs and discussed the procedure including the risks, benefits and alternatives for the proposed anesthesia with the patient or authorized representative who has indicated his/her understanding and acceptance.     Dental Advisory Given  Plan Discussed with: Anesthesiologist, CRNA and Surgeon  Anesthesia Plan Comments: (Discussed interscalene block with patient including the risks which included cardiac arrest, seizures, pneumothorax, difficulty breathing, infection, nerve damage, and the block not working well.  The patient understands the risks and wishes to proceed with the block requested by the surgeon.)      Anesthesia Quick Evaluation

## 2020-12-16 NOTE — Op Note (Signed)
12/16/2020  3:44 PM  Patient:   [name]  Pre-Op Diagnosis:   Impingement/tendinopathy with interstitial tear of supraspinatus tendon and biceps tendinopathy, right shoulder.  Post-Op Diagnosis:   Impingement/tendinopathy with interstitial tear of supraspinatus tendon, biceps tendinopathy, and Type I labral tear, right shoulder.  Procedure:   Extensive arthroscopic debridement, arthroscopic subacromial decompression, and mini-open biceps tenodesis, right shoulder.  Anesthesia:   General endotracheal with interscalene block using Exparel placed preoperatively by the anesthesiologist.  Surgeon:   Maryagnes Amos, MD  Assistant:   Griffin Basil, RNFA; Arcola Jansky, PA-S  Findings:   As above.  There was moderate type I tearing of the superior portion of the labrum without frank detachment from the glenoid rim.  Both the articular and bursal surfaces of the rotator cuff were in satisfactory condition.  There was moderate tendinopathy of the biceps tendon with partial-thickness tearing.  The articular surfaces of the glenoid and humerus both were in satisfactory condition.  Complications:   None  Fluids:   700 cc  Estimated blood loss:   10 cc  Tourniquet time:   None  Drains:   None  Closure:   Staples      Brief clinical note:   The patient is a 45 year old female with a history of progressively worsening right shoulder pain. The patient's symptoms have progressed despite medications, activity modification, etc. The patient's history and examination are consistent with impingement/tendinopathy with a possible rotator cuff tear. These findings were confirmed by MRI scan. The patient presents at this time for definitive management of these shoulder symptoms.  Procedure:   The patient underwent placement of an interscalene block using Exparel by the anesthesiologist in the preoperative holding area before being brought into the operating room and lain in the supine position. The  patient then underwent general endotracheal intubation and anesthesia before being repositioned in the beach chair position using the beach chair positioner. The right shoulder and upper extremity were prepped with ChloraPrep solution before being draped sterilely. Preoperative antibiotics were administered. A timeout was performed to confirm the proper surgical site before the expected portal sites and incision site were injected with 0.5% Sensorcaine with epinephrine.   A posterior portal was created and the glenohumeral joint thoroughly inspected with the findings as described above. An anterior portal was created using an outside-in technique. The labrum and rotator cuff were further probed, again confirming the above-noted findings. The areas of labral tearing were debrided back to stable margins using the full-radius resector, as were areas of synovitis anteriorly and superiorly. The ArthroCare wand was inserted and used to release the biceps tendon from its labral anchor. It also was used to obtain hemostasis as well as to "anneal" the labrum superiorly and anteriorly. The instruments were removed from the joint after suctioning the excess fluid.  The camera was repositioned through the posterior portal into the subacromial space. A separate lateral portal was created using an outside-in technique. The 3.5 mm full-radius resector was introduced and used to perform a subtotal bursectomy. The ArthroCare wand was then inserted and used to remove the periosteal tissue off the undersurface of the anterior third of the acromion as well as to recess the coracoacromial ligament from its attachment along the anterior and lateral margins of the acromion. The 4.0 mm acromionizing bur was introduced and used to complete the decompression by removing the undersurface of the anterior third of the acromion. The full radius resector was reintroduced to remove any residual bony debris before  the ArthroCare wand was  reintroduced to obtain hemostasis. The instruments were then removed from the subacromial space after suctioning the excess fluid.  An approximately 4-5 cm incision was made over the anterolateral aspect of the shoulder beginning at the anterolateral corner of the acromion and extending distally in line with the bicipital groove. This incision was carried down through the subcutaneous tissues to expose the deltoid fascia. The raphae between the anterior and middle thirds was identified and this plane developed to provide access into the subacromial space. Additional bursal tissues were debrided sharply using Metzenbaum scissors. The rotator cuff was readily identified.  It was carefully inspected and palpated but no obvious bursal surface tears were identified.  The area involving the insertional fibers of the supraspinatus tendon identified on MRI scan as having an interstitial tear was identified by palpation.  Given that both the articular and bursal surfaces were in satisfactory condition, the area of interstitial tearing was perforated numerous times with an 18-gauge needle to try to stimulate bleeding into the interstitial tear in order to encourage its healing.  The bicipital groove was identified by palpation and opened for 1-1.5 cm. The biceps tendon stump was retrieved through this defect. The floor of the bicipital groove was roughened with a curet before a single Biomet 2.9 mm JuggerKnot anchor was inserted. Both sets of sutures were passed through the biceps tendon and tied securely to effect the tenodesis. The bicipital sheath was reapproximated using two #0 Ethibond interrupted sutures, incorporating the biceps tendon to further reinforce the tenodesis.  The wound was copiously irrigated with sterile saline solution before the deltoid raphae was reapproximated using 2-0 Vicryl interrupted sutures. The subcutaneous tissues were closed in two layers using 2-0 Vicryl interrupted sutures before the  skin was closed using staples. The portal sites also were closed using staples. A sterile bulky dressing was applied to the shoulder before the arm was placed into a shoulder immobilizer. The patient was then awakened, extubated, and returned to the recovery room in satisfactory condition after tolerating the procedure well.

## 2020-12-17 ENCOUNTER — Encounter: Payer: Self-pay | Admitting: Surgery

## 2020-12-21 NOTE — Anesthesia Postprocedure Evaluation (Signed)
Anesthesia Post Note  Patient: [name]  Procedure(s) Performed: Extensive arthroscopic debridement, arthroscopic subacromial decompression, and mini-open biceps tenodesis, right shoulder.  (Right Shoulder)  Patient location during evaluation: PACU Anesthesia Type: General Level of consciousness: awake and alert and oriented Pain management: pain level controlled Vital Signs Assessment: post-procedure vital signs reviewed and stable Respiratory status: spontaneous breathing Cardiovascular status: blood pressure returned to baseline Anesthetic complications: no   No complications documented.   Last Vitals:  Vitals:   12/16/20 1645 12/16/20 1729  BP: 129/75 123/71  Pulse: 76 80  Resp: 18 18  Temp: (!) 36.1 C (!) 36.1 C  SpO2: 93% 98%    Last Pain:  Vitals:   12/17/20 0803  TempSrc:   PainSc: 6                  Kiah Keay

## 2021-02-15 ENCOUNTER — Other Ambulatory Visit: Payer: Self-pay | Admitting: Otolaryngology

## 2021-02-15 DIAGNOSIS — E039 Hypothyroidism, unspecified: Secondary | ICD-10-CM

## 2021-03-09 ENCOUNTER — Ambulatory Visit: Payer: Commercial Managed Care - PPO | Attending: Otolaryngology

## 2021-06-06 ENCOUNTER — Other Ambulatory Visit: Payer: Self-pay

## 2021-06-06 ENCOUNTER — Ambulatory Visit
Admission: RE | Admit: 2021-06-06 | Discharge: 2021-06-06 | Disposition: A | Discharge status: Home / Self Care | Payer: Commercial Managed Care - PPO | Source: Ambulatory Visit | Attending: Otolaryngology | Admitting: Otolaryngology

## 2021-06-06 DIAGNOSIS — E039 Hypothyroidism, unspecified: Secondary | ICD-10-CM | POA: Diagnosis present

## 2021-09-22 ENCOUNTER — Other Ambulatory Visit: Payer: Self-pay | Admitting: Obstetrics and Gynecology

## 2021-09-22 DIAGNOSIS — Z1231 Encounter for screening mammogram for malignant neoplasm of breast: Secondary | ICD-10-CM

## 2021-11-16 ENCOUNTER — Ambulatory Visit
Admission: RE | Admit: 2021-11-16 | Discharge: 2021-11-16 | Disposition: A | Discharge status: Home / Self Care | Payer: Commercial Managed Care - PPO | Source: Ambulatory Visit | Attending: Obstetrics and Gynecology | Admitting: Obstetrics and Gynecology

## 2021-11-16 ENCOUNTER — Other Ambulatory Visit: Payer: Self-pay

## 2021-11-16 DIAGNOSIS — Z1231 Encounter for screening mammogram for malignant neoplasm of breast: Secondary | ICD-10-CM | POA: Diagnosis not present

## 2022-03-09 DIAGNOSIS — Z9289 Personal history of other medical treatment: Secondary | ICD-10-CM

## 2022-03-09 HISTORY — DX: Personal history of other medical treatment: Z92.89

## 2022-03-31 ENCOUNTER — Emergency Department
Admission: EM | Admit: 2022-03-31 | Discharge: 2022-03-31 | Disposition: A | Discharge status: Home / Self Care | Payer: Commercial Managed Care - PPO | Attending: Emergency Medicine | Admitting: Emergency Medicine

## 2022-03-31 ENCOUNTER — Other Ambulatory Visit: Payer: Self-pay

## 2022-03-31 DIAGNOSIS — J45909 Unspecified asthma, uncomplicated: Secondary | ICD-10-CM | POA: Insufficient documentation

## 2022-03-31 DIAGNOSIS — I10 Essential (primary) hypertension: Secondary | ICD-10-CM | POA: Insufficient documentation

## 2022-03-31 DIAGNOSIS — E039 Hypothyroidism, unspecified: Secondary | ICD-10-CM | POA: Insufficient documentation

## 2022-03-31 DIAGNOSIS — D649 Anemia, unspecified: Secondary | ICD-10-CM | POA: Diagnosis not present

## 2022-03-31 DIAGNOSIS — R5383 Other fatigue: Secondary | ICD-10-CM | POA: Diagnosis present

## 2022-03-31 LAB — CBC
HCT: 26.9 % — ABNORMAL LOW (ref 36.0–46.0)
Hemoglobin: 7.1 g/dL — ABNORMAL LOW (ref 12.0–15.0)
MCH: 19.3 pg — ABNORMAL LOW (ref 26.0–34.0)
MCHC: 26.4 g/dL — ABNORMAL LOW (ref 30.0–36.0)
MCV: 73.1 fL — ABNORMAL LOW (ref 80.0–100.0)
Platelets: 196 10*3/uL (ref 150–400)
RBC: 3.68 MIL/uL — ABNORMAL LOW (ref 3.87–5.11)
RDW: 17.7 % — ABNORMAL HIGH (ref 11.5–15.5)
WBC: 4.5 10*3/uL (ref 4.0–10.5)
nRBC: 0 % (ref 0.0–0.2)

## 2022-03-31 LAB — BASIC METABOLIC PANEL
Anion gap: 11 (ref 5–15)
BUN: 14 mg/dL (ref 6–20)
CO2: 21 mmol/L — ABNORMAL LOW (ref 22–32)
Calcium: 8.9 mg/dL (ref 8.9–10.3)
Chloride: 105 mmol/L (ref 98–111)
Creatinine, Ser: 0.6 mg/dL (ref 0.44–1.00)
GFR, Estimated: >60 mL/min (ref >60)
Glucose, Bld: 215 mg/dL — ABNORMAL HIGH (ref 70–99)
Potassium: 3.9 mmol/L (ref 3.5–5.1)
Sodium: 137 mmol/L (ref 135–145)

## 2022-03-31 LAB — PREPARE RBC (CROSSMATCH)

## 2022-03-31 MED ORDER — SODIUM CHLORIDE 0.9 % IV SOLN: 10.0000 mL/h | Freq: Once | INTRAVENOUS | Status: DC

## 2022-03-31 NOTE — ED Notes (Signed)
Pt verbalized understanding of discharge instructions and follow-up care instructions. Pt advised if symptoms worsen to return to ED.  

## 2022-03-31 NOTE — ED Provider Notes (Signed)
[facility] Provider Note    Event Date/Time   First MD Initiated Contact with Patient 03/31/22 1311     (approximate)   History   abnormal labs   HPI  [name] is a 46 y.o. female presenting to the emergency department for treatment and evaluation of a hemoglobin of 7.  She denies dark or tarry stool.  She reports fatigue and occasional dizziness.  History of anemia related to heavy menses with blood transfusion a few years ago.  No additional vaginal bleeding after ablation. She has also had a gastric sleeve. No abdominal pain. Sent from [facility] for transfusion.  Past Medical History:  Diagnosis Date   Anemia    H/O YRS AGO   Asthma    well controlled   Diabetes mellitus without complication (HCC)    weight loss s/p gastric sleeve. just monitored   DVT (deep venous thrombosis) (HCC) 10/2017   left leg...to get an ivc filter prior to arthroscopy   GERD (gastroesophageal reflux disease)    Heart murmur    asymptomatic   Hypertension    Hypothyroidism      Physical Exam   Triage Vital Signs: ED Triage Vitals  Enc Vitals Group     BP 03/31/22 1210 140/73     Pulse Rate 03/31/22 1210 (!) 101     Resp 03/31/22 1210 18     Temp 03/31/22 1210 99 F (37.2 C)     Temp Source 03/31/22 1210 Oral     SpO2 03/31/22 1210 97 %     Weight 03/31/22 1214 210 lb (95.3 kg)     Height 03/31/22 1214 5\' 2"  (1.575 m)     Head Circumference --      Peak Flow --      Pain Score 03/31/22 1214 0     Pain Loc --      Pain Edu? --      Excl. in GC? --     Most recent vital signs: Vitals:   03/31/22 1700 03/31/22 1730  BP: 109/67 112/70  Pulse: 72 72  Resp: 17 17  Temp:  98.8 F (37.1 C)  SpO2: 100% 99%    General: Awake, no distress.  CV:  Good peripheral perfusion.  Resp:  Normal effort.  Abd:  No distention.  Other:     ED Results / Procedures / Treatments   Labs (all labs ordered are listed, but only abnormal results are displayed) Labs  Reviewed  CBC - Abnormal; Notable for the following components:      Result Value   RBC 3.68 (*)    Hemoglobin 7.1 (*)    HCT 26.9 (*)    MCV 73.1 (*)    MCH 19.3 (*)    MCHC 26.4 (*)    RDW 17.7 (*)    All other components within normal limits  BASIC METABOLIC PANEL - Abnormal; Notable for the following components:   CO2 21 (*)    Glucose, Bld 215 (*)    All other components within normal limits  TYPE AND SCREEN  PREPARE RBC (CROSSMATCH)  ABO/RH     EKG  Not indicated.   RADIOLOGY  Not indicated  I have independently reviewed and interpreted imaging as well as reviewed report from radiology.  PROCEDURES:  Critical Care performed: No  Procedures   MEDICATIONS ORDERED IN ED:  Medications  0.9 %  sodium chloride infusion (has no administration in time range)     IMPRESSION /  MDM / ASSESSMENT AND PLAN / ED COURSE   I reviewed the triage vital signs and the nursing notes.  Differential diagnosis includes, but is not limited to: Symptomatic anemia, GI bleed, chronic blood loss.  Patient's presentation is most consistent with acute presentation with potential threat to life or bodily function.  The patient is on the cardiac monitor to evaluate for evidence of arrhythmia and/or significant heart rate changes.  46 year old female presenting to the emergency department for treatment of anemia.  See HPI for further details.  Hemoglobin checked outpatient was 7.0.  Recheck here in the emergency department shows a hemoglobin of 7.1.  Hemoccult is negative.  Patient denies having any vaginal bleeding after having an ablation a few years ago.  Because she is symptomatic and no source identified for blood loss, plan will be to transfuse 1 unit while here in the ER and discharge home for outpatient GI follow up. Patient aware and agreeable to the plan.  Clinical Course as of 03/31/22 1850  Fri Mar 31, 2022  1557 Blood is infusing.  Patient denies itching or other  complaints.  Sinus rhythm on bedside monitor with stable blood pressure and oxygen saturation. [CT]    Clinical Course User Index [CT] Lynda Capistran B, FNP   Patient had no reported adverse effects of the unit of blood.  She was given follow-up information for GI and encouraged to call this afternoon or Monday morning to request an appointment.  She was discharged home in stable condition.  FINAL CLINICAL IMPRESSION(S) / ED DIAGNOSES   Final diagnoses:  Symptomatic anemia     Rx / DC Orders   ED Discharge Orders     None        Note:  This document was prepared using Dragon voice recognition software and may include unintentional dictation errors.   Chinita Pester, FNP 03/31/22 1850    Minna Antis, MD 04/09/22 [phone]

## 2022-03-31 NOTE — ED Triage Notes (Signed)
First Nurse Note:  Sent to ED due to HGB:  7.  Per report, patient needs blood transfusion.  AAOx3.  Skin warm and dry. NAD

## 2022-04-01 LAB — TYPE AND SCREEN
ABO/RH(D): A POS
Antibody Screen: NEGATIVE
Unit division: 0

## 2022-04-01 LAB — BPAM RBC
Blood Product Expiration Date: 202306302359
ISSUE DATE / TIME: 202306231430
Unit Type and Rh: 6200

## 2022-04-01 LAB — ABO/RH: ABO/RH(D): A POS

## 2022-07-05 ENCOUNTER — Other Ambulatory Visit: Payer: Self-pay | Admitting: Student

## 2022-07-05 DIAGNOSIS — M75111 Incomplete rotator cuff tear or rupture of right shoulder, not specified as traumatic: Secondary | ICD-10-CM

## 2022-07-05 DIAGNOSIS — M7521 Bicipital tendinitis, right shoulder: Secondary | ICD-10-CM

## 2022-07-05 DIAGNOSIS — M7581 Other shoulder lesions, right shoulder: Secondary | ICD-10-CM

## 2022-07-05 DIAGNOSIS — G8929 Other chronic pain: Secondary | ICD-10-CM

## 2022-07-24 ENCOUNTER — Ambulatory Visit
Admission: RE | Admit: 2022-07-24 | Discharge: 2022-07-24 | Disposition: A | Discharge status: Home / Self Care | Payer: Commercial Managed Care - PPO | Source: Ambulatory Visit | Attending: Student | Admitting: Student

## 2022-07-24 DIAGNOSIS — M7521 Bicipital tendinitis, right shoulder: Secondary | ICD-10-CM

## 2022-07-24 DIAGNOSIS — G8929 Other chronic pain: Secondary | ICD-10-CM

## 2022-07-24 DIAGNOSIS — M75111 Incomplete rotator cuff tear or rupture of right shoulder, not specified as traumatic: Secondary | ICD-10-CM

## 2022-07-24 DIAGNOSIS — M7581 Other shoulder lesions, right shoulder: Secondary | ICD-10-CM

## 2022-08-18 ENCOUNTER — Other Ambulatory Visit: Payer: Self-pay | Admitting: Surgery

## 2022-08-25 ENCOUNTER — Encounter
Admission: RE | Admit: 2022-08-25 | Discharge: 2022-08-25 | Disposition: A | Discharge status: Home / Self Care | Payer: BC Managed Care – PPO | Source: Ambulatory Visit | Attending: Surgery | Admitting: Surgery

## 2022-08-25 VITALS — Ht 62.0 in | Wt 197.5 lb

## 2022-08-25 DIAGNOSIS — Z79899 Other long term (current) drug therapy: Secondary | ICD-10-CM

## 2022-08-25 DIAGNOSIS — I10 Essential (primary) hypertension: Secondary | ICD-10-CM

## 2022-08-25 DIAGNOSIS — E119 Type 2 diabetes mellitus without complications: Secondary | ICD-10-CM

## 2022-08-25 DIAGNOSIS — Z01818 Encounter for other preprocedural examination: Secondary | ICD-10-CM

## 2022-08-25 DIAGNOSIS — Z01812 Encounter for preprocedural laboratory examination: Secondary | ICD-10-CM

## 2022-08-25 NOTE — Patient Instructions (Signed)
Your procedure is scheduled on:09-07-22 Thursday Report to the Registration Desk on the 1st floor of the [facility].Then proceed to the 2nd floor Surgery Desk To find out your arrival time, please call [phone] between 1PM - 3PM on:09-06-22 Wednesday If your arrival time is 6:00 am, do not arrive prior to that time as the [facility] entrance doors do not open until 6:00 am.  REMEMBER: Instructions that are not followed completely may result in serious medical risk, up to and including death; or upon the discretion of your surgeon and anesthesiologist your surgery may need to be rescheduled.  Do not eat food after midnight the night before surgery.  No gum chewing, lozengers or hard candies.  You may however, drink Water up to 2 hours before you are scheduled to arrive for your surgery. Do not drink anything within 2 hours of your scheduled arrival time.  Type 1 and Type 2 diabetics should only drink water.  In addition, your doctor has ordered for you to drink the provided  Gatorade G2 Drinking this carbohydrate drink up to two hours before surgery helps to reduce insulin resistance and improve patient outcomes. Please complete drinking 2 hours prior to scheduled arrival time.  TAKE THESE MEDICATIONS THE MORNING OF SURGERY WITH A SIP OF WATER: -levothyroxine (SYNTHROID, LEVOTHROID)  -liothyronine (CYTOMEL)  -omeprazole (PRILOSEC)   Stop your tirzepatide [facility]) 7 days prior to surgery-Last dose will be on 08-30-22 Wednesday  Use your Albuterol Inhaler the day of surgery and bring your Inhaler to the hospital  Ask Dr Joice Lofts when you see him on 08-29-22 when you need to stop your 81 mg Aspirin  One week prior to surgery: Stop Anti-inflammatories (NSAIDS) such as Advil, Aleve, Ibuprofen, Motrin, Naproxen, Naprosyn and Aspirin based products such as Excedrin, Goodys Powder, BC Powder.You may however, continue to take Tylenol if needed for pain up until the day of  surgery.  Stop ANY OVER THE COUNTER supplements/vitamins 7 days prior to surgery (Vitamin D3)-You may continue your Melatonin if needed up until the night prior to surgery  No Alcohol for 24 hours before or after surgery.  No Smoking including e-cigarettes for 24 hours prior to surgery.  No chewable tobacco products for at least 6 hours prior to surgery.  No nicotine patches on the day of surgery.  Do not use any "recreational" drugs for at least a week prior to your surgery.  Please be advised that the combination of cocaine and anesthesia may have negative outcomes, up to and including death. If you test positive for cocaine, your surgery will be cancelled.  On the morning of surgery brush your teeth with toothpaste and water, you may rinse your mouth with mouthwash if you wish. Do not swallow any toothpaste or mouthwash.  Use CHG Soap as directed on instruction sheet.  Do not wear jewelry, make-up, hairpins, clips or nail polish.  Do not wear lotions, powders, or perfumes.   Do not shave body from the neck down 48 hours prior to surgery just in case you cut yourself which could leave a site for infection.  Also, freshly shaved skin may become irritated if using the CHG soap.  Contact lenses, hearing aids and dentures may not be worn into surgery.  Do not bring valuables to the hospital. [facility] is not responsible for any missing/lost belongings or valuables.  Notify your doctor if there is any change in your medical condition (cold, fever, infection).  Wear comfortable clothing (specific to your surgery  type) to the hospital.  After surgery, you can help prevent lung complications by doing breathing exercises.  Take deep breaths and cough every 1-2 hours. Your doctor may order a device called an Incentive Spirometer to help you take deep breaths. When coughing or sneezing, hold a pillow firmly against your incision with both hands. This is called "splinting." Doing this  helps protect your incision. It also decreases belly discomfort.  If you are being admitted to the hospital overnight, leave your suitcase in the car. After surgery it may be brought to your room.  If you are being discharged the day of surgery, you will not be allowed to drive home. You will need a responsible adult (18 years or older) to drive you home and stay with you that night.   If you are taking public transportation, you will need to have a responsible adult (18 years or older) with you. Please confirm with your physician that it is acceptable to use public transportation.   Please call the Pre-admissions Testing Dept. at [phone] if you have any questions about these instructions.  Surgery Visitation Policy:  Patients undergoing a surgery or procedure may have two family members or support persons with them as long as the person is not COVID-19 positive or experiencing its symptoms.   MASKING: Due to an increase in RSV rates and hospitalizations, starting Wednesday, Nov. 15, in patient care areas in which we serve newborns, infants and children, masks will be required for teammates and visitors.  Children ages 34 and under may not visit. This policy affects the following departments only:  [facility] Postpartum area Mother Baby Unit Newborn nursery/Special care nursery  Other areas: Masks continue to be strongly recommended for patient-facing teammates, visitors and patients in all other areas. Visitation is not restricted outside of the units listed above.  How to Use an Incentive Spirometer An incentive spirometer is a tool that measures how well you are filling your lungs with each breath. Learning to take long, deep breaths using this tool can help you keep your lungs clear and active. This may help to reverse or lessen your chance of developing breathing (pulmonary) problems, especially infection. You may be asked to use a spirometer: After a  surgery. If you have a lung problem or a history of smoking. After a long period of time when you have been unable to move or be active. If the spirometer includes an indicator to show the highest number that you have reached, your health care provider or respiratory therapist will help you set a goal. Keep a log of your progress as told by your health care provider. What are the risks? Breathing too quickly may cause dizziness or cause you to pass out. Take your time so you do not get dizzy or light-headed. If you are in pain, you may need to take pain medicine before doing incentive spirometry. It is harder to take a deep breath if you are having pain. How to use your incentive spirometer  Sit up on the edge of your bed or on a chair. Hold the incentive spirometer so that it is in an upright position. Before you use the spirometer, breathe out normally. Place the mouthpiece in your mouth. Make sure your lips are closed tightly around it. Breathe in slowly and as deeply as you can through your mouth, causing the piston or the ball to rise toward the top of the chamber. Hold your breath for 3-5 seconds,  or for as long as possible. If the spirometer includes a coach indicator, use this to guide you in breathing. Slow down your breathing if the indicator goes above the marked areas. Remove the mouthpiece from your mouth and breathe out normally. The piston or ball will return to the bottom of the chamber. Rest for a few seconds, then repeat the steps 10 or more times. Take your time and take a few normal breaths between deep breaths so that you do not get dizzy or light-headed. Do this every 1-2 hours when you are awake. If the spirometer includes a goal marker to show the highest number you have reached (best effort), use this as a goal to work toward during each repetition. After each set of 10 deep breaths, cough a few times. This will help to make sure that your lungs are clear. If you have  an incision on your chest or abdomen from surgery, place a pillow or a rolled-up towel firmly against the incision when you cough. This can help to reduce pain while taking deep breaths and coughing. General tips When you are able to get out of bed: Walk around often. Continue to take deep breaths and cough in order to clear your lungs. Keep using the incentive spirometer until your health care provider says it is okay to stop using it. If you have been in the hospital, you may be told to keep using the spirometer at home. Contact a health care provider if: You are having difficulty using the spirometer. You have trouble using the spirometer as often as instructed. Your pain medicine is not giving enough relief for you to use the spirometer as told. You have a fever. Get help right away if: You develop shortness of breath. You develop a cough with bloody mucus from the lungs. You have fluid or blood coming from an incision site after you cough. Summary An incentive spirometer is a tool that can help you learn to take long, deep breaths to keep your lungs clear and active. You may be asked to use a spirometer after a surgery, if you have a lung problem or a history of smoking, or if you have been inactive for a long period of time. Use your incentive spirometer as instructed every 1-2 hours while you are awake. If you have an incision on your chest or abdomen, place a pillow or a rolled-up towel firmly against your incision when you cough. This will help to reduce pain. Get help right away if you have shortness of breath, you cough up bloody mucus, or blood comes from your incision when you cough. This information is not intended to replace advice given to you by your health care provider. Make sure you discuss any questions you have with your health care provider. Document Revised: 12/15/2019 Document Reviewed: 12/15/2019 Elsevier Patient Education  2023 [employer].

## 2022-08-28 NOTE — Progress Notes (Signed)
  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 In review of her medication reconciliation it was noted that patient is on a prescribed GLP-1 medication. Per guidelines issued by the American Society of Anesthesiologists (ASA), it is recommended that these medications be held for 7 days prior to the patient undergoing any type of elective surgical procedure. The patient is taking the following GLP-1 medication:  []  SEMAGLUTIDE   []  EXENATIDE  []  LIRAGLUTIDE   []  LIXISENATIDE  []  DULAGLUTIDE     [x]  OTHER GLP-1/GIP medication: tirzepatide  Reached out to prescribing provider , MD) to make them aware of the guidelines from anesthesia. Given that this patient takes the prescribed GLP-1 medication for her  diabetes diagnosis, rather than for weight loss, recommendations from the prescribing provider were solicited. Prescribing provider made aware of the following so that informed decision/POC can be developed for this patient that may be taking medications belonging to these drug classes:  Oral GLP-1 medications will be held 1 day prior to surgery.  Injectable GLP-1 medications will be held 7 days prior to surgery.  Metformin is routinely held  48 hours prior to surgery due to renal concerns, potential need for contrasted imaging perioperatively, and the potential for tissue hypoxia leading to drug induced lactic acidosis.  All SGLT2i medications are held 72 hours prior to surgery as they can be associated with the increased potential for developing euglycemic diabetic ketoacidosis (EDKA).   Impression and Plan:  LANELL Wolfe is on a prescribed GLP-1 medication, which induces the known side effect of decreased gastric emptying. Efforts are bring made to mitigate the risk of perioperative hyperglycemic events, as elevated blood glucose levels have been found to contribute to intra/postoperative complications. Additionally, hyperglycemic extremes can potentially necessitate the postponing of a patient's elective case in order to better optimize perioperative glycemic control, again with the aforementioned guidelines in place. With this in mind, recommendations have been sought from the prescribing provider, who has cleared patient to proceed with holding the prescribed GLP-1/GIP as per the guidelines from the ASA.   Provider recommending: no further recommendations received from the prescribing provider.  Copy of signed clearance and recommendations placed on patient's chart for inclusion in their medical record and for review by the surgical/anesthetic team on the day of her procedure.   , MSN, APRN, FNP-C, CEN [facility]  Peri-operative Services Nurse Practitioner Phone: [phone] 08/28/22 2:04 PM  NOTE: This note has been prepared using Dragon dictation software. Despite my best ability to proofread, there is always the potential that unintentional transcriptional errors may still occur from this process.

## 2022-08-29 ENCOUNTER — Encounter
Admission: RE | Admit: 2022-08-29 | Discharge: 2022-08-29 | Disposition: A | Discharge status: Home / Self Care | Payer: BC Managed Care – PPO | Source: Ambulatory Visit | Attending: Surgery | Admitting: Surgery

## 2022-08-29 ENCOUNTER — Telehealth: Payer: Self-pay | Admitting: Urgent Care

## 2022-08-29 DIAGNOSIS — Z01812 Encounter for preprocedural laboratory examination: Secondary | ICD-10-CM

## 2022-08-29 DIAGNOSIS — Z79899 Other long term (current) drug therapy: Secondary | ICD-10-CM | POA: Insufficient documentation

## 2022-08-29 DIAGNOSIS — E119 Type 2 diabetes mellitus without complications: Secondary | ICD-10-CM | POA: Insufficient documentation

## 2022-08-29 DIAGNOSIS — E876 Hypokalemia: Secondary | ICD-10-CM

## 2022-08-29 DIAGNOSIS — Z01818 Encounter for other preprocedural examination: Secondary | ICD-10-CM | POA: Diagnosis present

## 2022-08-29 DIAGNOSIS — I10 Essential (primary) hypertension: Secondary | ICD-10-CM | POA: Diagnosis not present

## 2022-08-29 LAB — BASIC METABOLIC PANEL
Anion gap: 8 (ref 5–15)
BUN: 16 mg/dL (ref 6–20)
CO2: 26 mmol/L (ref 22–32)
Calcium: 9.1 mg/dL (ref 8.9–10.3)
Chloride: 102 mmol/L (ref 98–111)
Creatinine, Ser: 0.64 mg/dL (ref 0.44–1.00)
GFR, Estimated: >60 mL/min (ref >60)
Glucose, Bld: 94 mg/dL (ref 70–99)
Potassium: 2.9 mmol/L — ABNORMAL LOW (ref 3.5–5.1)
Sodium: 136 mmol/L (ref 135–145)

## 2022-08-29 MED ORDER — POTASSIUM CHLORIDE CRYS ER 20 MEQ PO TBCR: EXTENDED_RELEASE_TABLET | ORAL | 0 refills | Status: AC

## 2022-08-29 NOTE — Progress Notes (Signed)
[facility] Perioperative Services: Pre-Admission/Anesthesia Testing  Abnormal Lab Notification and Treatment Plan of Care   Date: 08/29/22  Name: [name] MRN:   [id number]  Re: Abnormal labs noted during PAT appointment   Notified:  Provider Name Provider Role Notification Mode  Poggi, Jonny Ruiz, MD Orthopedics (Surgeon) Routed and/or faxed via Morton Stall, Marya Amsler, MD Primary Care Provider Routed and/or faxed via [facility]   Clinical Information and Notes:  ABNORMAL LAB VALUE(S): Lab Results  Component Value Date   K 2.9 (L) 08/29/2022   Theotis Burrow is scheduled for an elective RIGHT SHOULDER ARTHROSCOPY WITH DEBRIDEMENT, DECOMPRESSION, AND REPAIR OF HER PARTIAL-THICKNESS ROTATOR CUFF TEAR  on 09/07/2022. In review of her medication reconciliation, it is noted that the patient is taking prescribed diuretic medications (HCTZ 12.5 mg) daily. Please note, in efforts to promote a safe and effective anesthetic course, per current guidelines/standards set by the [facility] anesthesia team, the minimal acceptable K+ level for the patient to proceed with general anesthesia is 3.0 mmol/L. With that being said, if the patient drops any lower, her elective procedure will need to be postponed until K+ is better optimized. In efforts to prevent case cancellation, will make efforts to optimize pre-surgical K+ level so that patient can safely undergo the planned surgical intervention.   Impression and Plan:  [name] found to be HYPOkalemic at 2.9 mmol/L on preoperative labs. She is on thiazide diuretic therapy. Called patient to discuss results and plans for correction of noted electrolyte derangement as follows:  Meds ordered this encounter  Medications   potassium chloride SA (KLOR-CON M) 20 MEQ tablet    Sig: Take 2 tablets (40 mEq) today, then 1 tablet (20 mEq) daily until completed. Take dose of day of surgery. Follow up with PCP for recheck labs after surgery.     Dispense:  11 tablet    Refill:  0   Encouraged patient to follow up with PCP about 2-3 weeks postoperatively to have labs rechecked to ensure that levels are remaining within normal range. Discussed nutritional intake of K+ rich foods as an adjunctive way to keep her K+ levels normal; list of K+ rich foods verbally provided. Also mentioned ORS, however advised her not to rely solely on these drinks, as they are high in Na+, and she has a HTN diagnosis.   Will send copy of this note to surgeon and PCP to make them aware of K+ level and plans for correction. Discussed that PCP may elect to pursue a change in diuretic therapy to a K+ sparing type medication, or alternatively, they may consider adding a daily K+ supplement. Patient verbalized understanding. Order entered to recheck K+ on the day of her surgery to ensure optimization.  Patient verbalized understanding of POC as it stands at this point. Wished patient the best of luck with her upcoming surgery and subsequent recovery. She was encouraged to return call to the PAT clinic, or to her surgeon's office, should any questions or concerns arise between now and the time of her surgery. Patient was appreciative of the care/concern expressed by PAT staff.   Encounter Diagnoses  Name Primary?   Pre-operative laboratory examination Yes   Diuretic-induced hypokalemia    Quentin Mulling, MSN, APRN, FNP-C, CEN [facility]  Peri-operative Services Nurse Practitioner Phone: [phone] 08/29/22 1:42 PM  NOTE: This note has been prepared using Dragon dictation software. Despite my best ability to proofread, there is always the potential  that unintentional transcriptional errors may still occur from this process.

## 2022-09-07 ENCOUNTER — Ambulatory Visit
Admission: RE | Admit: 2022-09-07 | Discharge: 2022-09-07 | Disposition: A | Discharge status: Home / Self Care | Payer: BC Managed Care – PPO | Attending: Surgery | Admitting: Surgery

## 2022-09-07 ENCOUNTER — Ambulatory Visit: Payer: BC Managed Care – PPO

## 2022-09-07 ENCOUNTER — Other Ambulatory Visit: Payer: Self-pay

## 2022-09-07 ENCOUNTER — Encounter: Payer: Self-pay | Admitting: Surgery

## 2022-09-07 ENCOUNTER — Ambulatory Visit: Payer: BC Managed Care – PPO | Admitting: Urgent Care

## 2022-09-07 ENCOUNTER — Encounter
Admission: RE | Disposition: A | Discharge status: Home / Self Care | Payer: Self-pay | Source: Home / Self Care | Attending: Surgery

## 2022-09-07 DIAGNOSIS — M75111 Incomplete rotator cuff tear or rupture of right shoulder, not specified as traumatic: Secondary | ICD-10-CM | POA: Diagnosis not present

## 2022-09-07 DIAGNOSIS — K219 Gastro-esophageal reflux disease without esophagitis: Secondary | ICD-10-CM | POA: Insufficient documentation

## 2022-09-07 DIAGNOSIS — E039 Hypothyroidism, unspecified: Secondary | ICD-10-CM | POA: Diagnosis not present

## 2022-09-07 DIAGNOSIS — M7521 Bicipital tendinitis, right shoulder: Secondary | ICD-10-CM | POA: Diagnosis not present

## 2022-09-07 DIAGNOSIS — Z01818 Encounter for other preprocedural examination: Secondary | ICD-10-CM

## 2022-09-07 DIAGNOSIS — Z7985 Long-term (current) use of injectable non-insulin antidiabetic drugs: Secondary | ICD-10-CM | POA: Diagnosis not present

## 2022-09-07 DIAGNOSIS — Z9884 Bariatric surgery status: Secondary | ICD-10-CM | POA: Diagnosis not present

## 2022-09-07 DIAGNOSIS — Z86718 Personal history of other venous thrombosis and embolism: Secondary | ICD-10-CM | POA: Insufficient documentation

## 2022-09-07 DIAGNOSIS — E119 Type 2 diabetes mellitus without complications: Secondary | ICD-10-CM | POA: Insufficient documentation

## 2022-09-07 DIAGNOSIS — Z01812 Encounter for preprocedural laboratory examination: Secondary | ICD-10-CM

## 2022-09-07 DIAGNOSIS — I10 Essential (primary) hypertension: Secondary | ICD-10-CM | POA: Diagnosis not present

## 2022-09-07 DIAGNOSIS — J45909 Unspecified asthma, uncomplicated: Secondary | ICD-10-CM | POA: Insufficient documentation

## 2022-09-07 DIAGNOSIS — E876 Hypokalemia: Secondary | ICD-10-CM

## 2022-09-07 DIAGNOSIS — Z79899 Other long term (current) drug therapy: Secondary | ICD-10-CM | POA: Insufficient documentation

## 2022-09-07 DIAGNOSIS — M7581 Other shoulder lesions, right shoulder: Secondary | ICD-10-CM | POA: Diagnosis not present

## 2022-09-07 DIAGNOSIS — M25811 Other specified joint disorders, right shoulder: Secondary | ICD-10-CM | POA: Diagnosis not present

## 2022-09-07 HISTORY — PX: SHOULDER ARTHROSCOPY: SHX128

## 2022-09-07 LAB — GLUCOSE, CAPILLARY: Glucose-Capillary: 148 mg/dL — ABNORMAL HIGH (ref 70–99)

## 2022-09-07 LAB — POCT I-STAT, CHEM 8
BUN: 17 mg/dL (ref 6–20)
Calcium, Ion: 1.18 mmol/L (ref 1.15–1.40)
Chloride: 106 mmol/L (ref 98–111)
Creatinine, Ser: 0.7 mg/dL (ref 0.44–1.00)
Glucose, Bld: 89 mg/dL (ref 70–99)
HCT: 36 % (ref 36.0–46.0)
Hemoglobin: 12.2 g/dL (ref 12.0–15.0)
Potassium: 3.8 mmol/L (ref 3.5–5.1)
Sodium: 140 mmol/L (ref 135–145)
TCO2: 22 mmol/L (ref 22–32)

## 2022-09-07 LAB — POCT PREGNANCY, URINE: Preg Test, Ur: NEGATIVE

## 2022-09-07 SURGERY — ARTHROSCOPY, SHOULDER
Anesthesia: Regional | Site: Shoulder | Laterality: Right

## 2022-09-07 MED ORDER — ONDANSETRON HCL 4 MG/2ML IJ SOLN: 4.0000 mg | Freq: Once | INTRAMUSCULAR | Status: DC | PRN

## 2022-09-07 MED ORDER — PROPOFOL 10 MG/ML IV BOLUS
INTRAVENOUS | Status: AC
  Filled 2022-09-07: qty 20

## 2022-09-07 MED ORDER — TRAMADOL HCL 50 MG PO TABS: 50.0000 mg | ORAL_TABLET | Freq: Four times a day (QID) | ORAL | Status: DC | PRN

## 2022-09-07 MED ORDER — FENTANYL CITRATE (PF) 100 MCG/2ML IJ SOLN
INTRAMUSCULAR | Status: AC
  Filled 2022-09-07: qty 2

## 2022-09-07 MED ORDER — FENTANYL CITRATE (PF) 100 MCG/2ML IJ SOLN
INTRAMUSCULAR | Status: DC | PRN
  Administered 2022-09-07 (×2): 50 ug via INTRAVENOUS

## 2022-09-07 MED ORDER — FENTANYL CITRATE PF 50 MCG/ML IJ SOSY: 50.0000 ug | PREFILLED_SYRINGE | INTRAMUSCULAR | Status: DC | PRN

## 2022-09-07 MED ORDER — VASOPRESSIN 20 UNIT/ML IV SOLN
INTRAVENOUS | Status: DC | PRN
  Administered 2022-09-07 (×2): 1 [IU] via INTRAVENOUS

## 2022-09-07 MED ORDER — DEXAMETHASONE SODIUM PHOSPHATE 10 MG/ML IJ SOLN
INTRAMUSCULAR | Status: DC | PRN
  Administered 2022-09-07: 10 mg via INTRAVENOUS

## 2022-09-07 MED ORDER — SEVOFLURANE IN SOLN
RESPIRATORY_TRACT | Status: AC
  Filled 2022-09-07: qty 250

## 2022-09-07 MED ORDER — ONDANSETRON HCL 4 MG/2ML IJ SOLN
INTRAMUSCULAR | Status: DC | PRN
  Administered 2022-09-07 (×2): 4 mg via INTRAVENOUS

## 2022-09-07 MED ORDER — SODIUM CHLORIDE 0.9 % IV SOLN: INTRAVENOUS | Status: DC

## 2022-09-07 MED ORDER — EPINEPHRINE PF 1 MG/ML IJ SOLN
INTRAMUSCULAR | Status: AC
  Filled 2022-09-07: qty 1

## 2022-09-07 MED ORDER — BUPIVACAINE-EPINEPHRINE (PF) 0.5% -1:200000 IJ SOLN
INTRAMUSCULAR | Status: DC | PRN
  Administered 2022-09-07: 30 mL

## 2022-09-07 MED ORDER — ROCURONIUM BROMIDE 100 MG/10ML IV SOLN
INTRAVENOUS | Status: DC | PRN
  Administered 2022-09-07: 50 mg via INTRAVENOUS

## 2022-09-07 MED ORDER — METOCLOPRAMIDE HCL 5 MG/ML IJ SOLN: 5.0000 mg | Freq: Three times a day (TID) | INTRAMUSCULAR | Status: DC | PRN

## 2022-09-07 MED ORDER — PHENYLEPHRINE HCL-NACL 20-0.9 MG/250ML-% IV SOLN
INTRAVENOUS | Status: DC | PRN
  Administered 2022-09-07: 20 ug/min via INTRAVENOUS

## 2022-09-07 MED ORDER — ONDANSETRON HCL 4 MG/2ML IJ SOLN: 4.0000 mg | Freq: Four times a day (QID) | INTRAMUSCULAR | Status: DC | PRN

## 2022-09-07 MED ORDER — TRAMADOL HCL 50 MG PO TABS: 50.0000 mg | ORAL_TABLET | Freq: Four times a day (QID) | ORAL | 0 refills | Status: AC | PRN

## 2022-09-07 MED ORDER — BUPIVACAINE HCL (PF) 0.5 % IJ SOLN
INTRAMUSCULAR | Status: AC
  Filled 2022-09-07: qty 20

## 2022-09-07 MED ORDER — LIDOCAINE HCL (PF) 1 % IJ SOLN
INTRAMUSCULAR | Status: AC
  Filled 2022-09-07: qty 5

## 2022-09-07 MED ORDER — CEFAZOLIN SODIUM-DEXTROSE 2-4 GM/100ML-% IV SOLN
2.0000 g | INTRAVENOUS | Status: AC
  Administered 2022-09-07: 2 g via INTRAVENOUS

## 2022-09-07 MED ORDER — CEFAZOLIN SODIUM-DEXTROSE 2-4 GM/100ML-% IV SOLN
INTRAVENOUS | Status: AC
  Filled 2022-09-07: qty 100

## 2022-09-07 MED ORDER — CHLORHEXIDINE GLUCONATE 0.12 % MT SOLN
OROMUCOSAL | Status: AC
  Filled 2022-09-07: qty 15

## 2022-09-07 MED ORDER — MIDAZOLAM HCL 2 MG/2ML IJ SOLN
INTRAMUSCULAR | Status: AC
  Administered 2022-09-07: 1.5 mg via INTRAVENOUS
  Filled 2022-09-07: qty 2

## 2022-09-07 MED ORDER — SODIUM CHLORIDE 0.9 % IR SOLN
Status: DC | PRN
  Administered 2022-09-07: 3001 mL

## 2022-09-07 MED ORDER — MIDAZOLAM HCL 2 MG/2ML IJ SOLN
1.0000 mg | INTRAMUSCULAR | Status: AC | PRN
  Administered 2022-09-07: 0.5 mg via INTRAVENOUS

## 2022-09-07 MED ORDER — ONDANSETRON HCL 4 MG PO TABS: 4.0000 mg | ORAL_TABLET | Freq: Four times a day (QID) | ORAL | Status: DC | PRN

## 2022-09-07 MED ORDER — ONDANSETRON 4 MG PO TBDP: 4.0000 mg | ORAL_TABLET | Freq: Three times a day (TID) | ORAL | 1 refills | Status: AC | PRN

## 2022-09-07 MED ORDER — ORAL CARE MOUTH RINSE: 15.0000 mL | Freq: Once | OROMUCOSAL | Status: AC

## 2022-09-07 MED ORDER — PROPOFOL 10 MG/ML IV BOLUS
INTRAVENOUS | Status: DC | PRN
  Administered 2022-09-07: 50 mg via INTRAVENOUS
  Administered 2022-09-07: 150 mg via INTRAVENOUS
  Administered 2022-09-07: 30 mg via INTRAVENOUS

## 2022-09-07 MED ORDER — DEXAMETHASONE SODIUM PHOSPHATE 10 MG/ML IJ SOLN
INTRAMUSCULAR | Status: AC
  Filled 2022-09-07: qty 1

## 2022-09-07 MED ORDER — SUGAMMADEX SODIUM 200 MG/2ML IV SOLN
INTRAVENOUS | Status: DC | PRN
  Administered 2022-09-07: 200 mg via INTRAVENOUS

## 2022-09-07 MED ORDER — PHENYLEPHRINE 80 MCG/ML (10ML) SYRINGE FOR IV PUSH (FOR BLOOD PRESSURE SUPPORT)
PREFILLED_SYRINGE | INTRAVENOUS | Status: DC | PRN
  Administered 2022-09-07 (×2): 80 ug via INTRAVENOUS

## 2022-09-07 MED ORDER — ONDANSETRON HCL 4 MG/2ML IJ SOLN
INTRAMUSCULAR | Status: AC
  Filled 2022-09-07: qty 2

## 2022-09-07 MED ORDER — METOCLOPRAMIDE HCL 10 MG PO TABS: 5.0000 mg | ORAL_TABLET | Freq: Three times a day (TID) | ORAL | Status: DC | PRN

## 2022-09-07 MED ORDER — FENTANYL CITRATE PF 50 MCG/ML IJ SOSY
PREFILLED_SYRINGE | INTRAMUSCULAR | Status: AC
  Filled 2022-09-07: qty 2

## 2022-09-07 MED ORDER — FENTANYL CITRATE (PF) 100 MCG/2ML IJ SOLN: 25.0000 ug | INTRAMUSCULAR | Status: DC | PRN

## 2022-09-07 MED ORDER — BUPIVACAINE LIPOSOME 1.3 % IJ SUSP
INTRAMUSCULAR | Status: AC
  Filled 2022-09-07: qty 10

## 2022-09-07 MED ORDER — VASOPRESSIN 20 UNIT/ML IV SOLN
INTRAVENOUS | Status: AC
  Filled 2022-09-07: qty 1

## 2022-09-07 MED ORDER — PHENYLEPHRINE HCL-NACL 20-0.9 MG/250ML-% IV SOLN
INTRAVENOUS | Status: AC
  Filled 2022-09-07: qty 250

## 2022-09-07 MED ORDER — CHLORHEXIDINE GLUCONATE 0.12 % MT SOLN
15.0000 mL | Freq: Once | OROMUCOSAL | Status: AC
  Administered 2022-09-07: 15 mL via OROMUCOSAL

## 2022-09-07 MED ORDER — BUPIVACAINE-EPINEPHRINE (PF) 0.5% -1:200000 IJ SOLN
INTRAMUSCULAR | Status: AC
  Filled 2022-09-07: qty 30

## 2022-09-07 MED ORDER — LIDOCAINE HCL (CARDIAC) PF 100 MG/5ML IV SOSY
PREFILLED_SYRINGE | INTRAVENOUS | Status: DC | PRN
  Administered 2022-09-07: 100 mg via INTRAVENOUS

## 2022-09-07 SURGICAL SUPPLY — 54 items
ANCH SUT 2 2.9 2 LD TPR NDL (Anchor) ×1 IMPLANT
ANCH SUT BN ASCP DLV (Anchor) ×1 IMPLANT
ANCH SUT RGNRT REGENETEN (Staple) ×1 IMPLANT
ANCHOR BONE REGENETEN (Anchor) IMPLANT
ANCHOR JUGGERKNOT WTAP NDL 2.9 (Anchor) IMPLANT
ANCHOR SUT QUATTRO KNTLS 4.5 (Anchor) IMPLANT
ANCHOR SUT W/ ORTHOCORD (Anchor) IMPLANT
ANCHOR TENDON REGENETEN (Staple) IMPLANT
APL PRP STRL LF DISP 70% ISPRP (MISCELLANEOUS) ×1
BIT DRILL JUGRKNT W/NDL BIT2.9 (DRILL) IMPLANT
BLADE FULL RADIUS 3.5 (BLADE) ×2 IMPLANT
BUR ACROMIONIZER 4.0 (BURR) ×2 IMPLANT
CANNULA SHAVER 8MMX76MM (CANNULA) ×2 IMPLANT
CHLORAPREP W/TINT 26 (MISCELLANEOUS) ×2 IMPLANT
COVER MAYO STAND REUSABLE (DRAPES) ×2 IMPLANT
DRILL JUGGERKNOT W/NDL BIT 2.9 (DRILL) ×1
ELECT REM PT RETURN 9FT ADLT (ELECTROSURGICAL) ×1
ELECTRODE REM PT RTRN 9FT ADLT (ELECTROSURGICAL) ×2 IMPLANT
GAUZE SPONGE 4X4 12PLY STRL (GAUZE/BANDAGES/DRESSINGS) ×2 IMPLANT
GAUZE XEROFORM 1X8 LF (GAUZE/BANDAGES/DRESSINGS) ×2 IMPLANT
GLOVE BIO SURGEON STRL SZ7.5 (GLOVE) ×4 IMPLANT
GLOVE BIO SURGEON STRL SZ8 (GLOVE) ×4 IMPLANT
GLOVE BIOGEL PI IND STRL 8 (GLOVE) ×2 IMPLANT
GLOVE SURG UNDER LTX SZ8 (GLOVE) ×2 IMPLANT
GOWN STRL REUS W/ TWL LRG LVL3 (GOWN DISPOSABLE) ×2 IMPLANT
GOWN STRL REUS W/ TWL XL LVL3 (GOWN DISPOSABLE) ×2 IMPLANT
GOWN STRL REUS W/TWL LRG LVL3 (GOWN DISPOSABLE) ×1
GOWN STRL REUS W/TWL XL LVL3 (GOWN DISPOSABLE) ×1
GRASPER SUT 15 45D LOW PRO (SUTURE) IMPLANT
IMPL REGENETEN MEDIUM (Shoulder) IMPLANT
IMPLANT REGENETEN MEDIUM (Shoulder) ×1 IMPLANT
IV LACTATED RINGER IRRG 3000ML (IV SOLUTION) ×1
IV LR IRRIG 3000ML ARTHROMATIC (IV SOLUTION) ×2 IMPLANT
MANIFOLD NEPTUNE II (INSTRUMENTS) ×4 IMPLANT
MASK FACE SPIDER DISP (MASK) ×2 IMPLANT
MAT ABSORB  FLUID 56X50 GRAY (MISCELLANEOUS) ×1
MAT ABSORB FLUID 56X50 GRAY (MISCELLANEOUS) ×2 IMPLANT
PACK ARTHROSCOPY SHOULDER (MISCELLANEOUS) ×2 IMPLANT
SLEEVE REMOTE CONTROL 5X12 (DRAPES) IMPLANT
SLING ARM LRG DEEP (SOFTGOODS) ×2 IMPLANT
SLING ULTRA II LG (MISCELLANEOUS) ×2 IMPLANT
SPONGE T-LAP 18X18 ~~LOC~~+RFID (SPONGE) ×2 IMPLANT
STAPLER SKIN PROX 35W (STAPLE) ×2 IMPLANT
STRAP SAFETY 5IN WIDE (MISCELLANEOUS) ×2 IMPLANT
SUT ETHIBOND 0 MO6 C/R (SUTURE) ×2 IMPLANT
SUT VIC AB 2-0 CT1 27 (SUTURE) ×2
SUT VIC AB 2-0 CT1 TAPERPNT 27 (SUTURE) ×4 IMPLANT
SUT VIC AB 3-0 SH 27 (SUTURE) ×2
SUT VIC AB 3-0 SH 27X BRD (SUTURE) IMPLANT
TAPE MICROFOAM 4IN (TAPE) ×2 IMPLANT
TRAP FLUID SMOKE EVACUATOR (MISCELLANEOUS) ×2 IMPLANT
TUBING INFLOW SET DBFLO PUMP (TUBING) ×2 IMPLANT
WAND WEREWOLF FLOW 90D (MISCELLANEOUS) ×2 IMPLANT
WATER STERILE IRR 500ML POUR (IV SOLUTION) ×2 IMPLANT

## 2022-09-07 NOTE — Transfer of Care (Signed)
Immediate Anesthesia Transfer of Care Note  Patient: [name]  Procedure(s) Performed: RIGHT SHOULDER ARTHROSCOPY WITH DEBRIDEMENT, DECOMPRESSION, AND REPAIR OF HER PARTIAL-THICKNESS ROTATOR CUFF TEAR (Right: Shoulder)  Patient Location: PACU  Anesthesia Type:General and Regional  Level of Consciousness: awake, alert , and oriented  Airway & Oxygen Therapy: Patient Spontanous Breathing and Patient connected to face mask oxygen  Post-op Assessment: Report given to RN, Post -op Vital signs reviewed and stable, and Patient moving all extremities  Post vital signs: Reviewed and stable  Last Vitals:  Vitals Value Taken Time  BP 104/60 09/07/22 1600  Temp 36.3 C 09/07/22 1551  Pulse 80 09/07/22 1601  Resp 20 09/07/22 1601  SpO2 100 % 09/07/22 1601  Vitals shown include unvalidated device data.  Last Pain:  Vitals:   09/07/22 1551  TempSrc:   PainSc: 0-No pain         Complications: No notable events documented.

## 2022-09-07 NOTE — Op Note (Signed)
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  Post-Op Diagnosis:   Impingement/tendinopathy with partial-thickness rotator cuff tear and degenerative labral fraying, right shoulder.  Procedure:   Limited arthroscopic debridement, arthroscopic subacromial decompression, and mini-open rotator cuff repair with a Smith [employer] Nephew Regeneten patch, right shoulder.  Anesthesia:   General endotracheal with interscalene block using Exparel placed preoperatively by the anesthesiologist.  Surgeon:   Maryagnes Amos, MD  Assistant:   Horris Latino, PA-C  Findings:   As above.  There was mild-moderate fraying of the superior and posterior superior portions of the labrum without frank detachment from the glenoid rim.  There was a small articular sided partial-thickness tear involving the mid insertional fibers of the supraspinatus tendon measuring approximately 0.5 x 1 cm and incorporating approximately 50 to 60% of the footprint.  The remainder of the rotator cuff was in satisfactory condition.  The articular surfaces of the glenoid and humerus both were in excellent condition.  Complications:   None  Fluids:   800 cc  Estimated blood loss:   5 cc  Tourniquet time:   None  Drains:   None  Closure:   3-0 Vicryl subcuticular sutures      Brief clinical note:   The patient is a 46 year old female who is now nearly 2 years status post a right shoulder arthroscopy with debridement, decompression, biceps tenodesis, and fenestration of a small interstitial tear involving the mid insertional fibers of the supraspinatus tendon. The patient did well following the procedure until she developed recurrent right shoulder pain about 5 months ago. The patient's symptoms have progressed despite medications, activity modification, etc. The patient's history and examination are consistent with  impingement/tendinopathy with a recurrent partial-thickness rotator cuff tear. These findings were confirmed by MRI scan. The patient presents at this time for definitive management of these shoulder symptoms.  Procedure:   The patient underwent placement of an interscalene block using Exparel by the anesthesiologist in the preoperative holding area before being brought into the operating room and lain in the supine position. The patient then underwent general endotracheal intubation and anesthesia before being repositioned in the beach chair position using the beach chair positioner. The right shoulder and upper extremity were prepped with ChloraPrep solution before being draped sterilely. Preoperative antibiotics were administered. A timeout was performed to confirm the proper surgical site before the expected portal sites and incision site were injected with 0.5% Sensorcaine with epinephrine.   A posterior portal was created and the glenohumeral joint thoroughly inspected with the findings as described above. An anterior portal was created using an outside-in technique. The labrum and rotator cuff were further probed, again confirming the above-noted findings. Areas of labral fraying were debrided back to stable margins using the full-radius resector. The ArthroCare wand was inserted and used to obtain hemostasis as well as to "anneal" the labrum superiorly and anteriorly. The instruments were removed from the joint after suctioning the excess fluid.  The camera was repositioned through the posterior portal into the subacromial space. A separate lateral portal was created using an outside-in technique. The 3.5 mm full-radius resector was introduced and used to perform a subtotal bursectomy. The ArthroCare wand was then inserted and used to release multiple adhesions in the subacromial space. It also was used to remove the periosteal and scar tissue off the undersurface of the anterior third of the acromion  as well as  to recess the coracoacromial ligament from its attachment along the anterior and lateral margins of the acromion. The 4.0 mm acromionizing bur was introduced and used to complete the decompression by removing the undersurface of the anterior third of the acromion. The full radius resector was reintroduced to remove any residual bony debris before the ArthroCare wand was reintroduced to obtain hemostasis. The instruments were then removed from the subacromial space after suctioning the excess fluid.  Utilizing the previous incision, an approximately 4-5 cm incision was made over the anterolateral aspect of the shoulder beginning at the anterolateral corner of the acromion and extending distally in line with the bicipital groove. This incision was carried down through the subcutaneous tissues to expose the deltoid fascia. The raphae between the anterior and middle thirds was identified and this plane developed to provide access into the subacromial space. Additional bursal tissues were debrided sharply using Metzenbaum scissors. The small partial-thickness rotator cuff tear was able to be identified by palpation. The partial-thickness tear was completed utilizing a #15 blade to incise the bursal surface fibers in the line of the fibers. The exposed greater tuberosity roughened with a rongeur. The tear was repaired using a single  [employer] 2.9 mm JuggerKnot anchor anchor. Given that this was a recurrent tear, it was felt best to optimize the likelihood of a satisfactory repair. Therefore, a medium size Smith [employer] Nephew Regeneten patch was selected and applied over the repair site. This patch was secured using appropriate bone and soft tissue staples. An apparent watertight closure was obtained.  The wound was copiously irrigated with sterile saline solution before the deltoid raphae was reapproximated using 2-0 Vicryl interrupted sutures. The subcutaneous tissues were closed using 2-0 Vicryl  interrupted sutures before the skin was closed using 3-0 Vicryl subcuticular sutures. The portal sites also were closed using 3-0 Vicryl subcuticular sutures. Sterile occlusive dressings were applied to all wounds before the arm was placed into a shoulder immobilizer. The patient was then awakened, extubated, and returned to the recovery room in satisfactory condition after tolerating the procedure well.

## 2022-09-07 NOTE — Anesthesia Procedure Notes (Signed)
Procedure Name: Intubation Date/Time: 09/07/2022 2:03 PM  Performed by: Esaw Grandchild, CRNAPre-anesthesia Checklist: Patient identified, Emergency Drugs available, Suction available and Patient being monitored Patient Re-evaluated:Patient Re-evaluated prior to induction Oxygen Delivery Method: Circle system utilized Preoxygenation: Pre-oxygenation with 100% oxygen Induction Type: IV induction Ventilation: Mask ventilation without difficulty Laryngoscope Size: Mac, 3 and McGraph Tube type: Oral Number of attempts: 1 Airway Equipment and Method: Stylet and Oral airway Placement Confirmation: ETT inserted through vocal cords under direct vision, positive ETCO2 and breath sounds checked- equal and bilateral Tube secured with: Tape Dental Injury: Teeth and Oropharynx as per pre-operative assessment

## 2022-09-07 NOTE — Anesthesia Preprocedure Evaluation (Signed)
Anesthesia Evaluation  Patient identified by MRN, date of birth, ID band Patient awake    Reviewed: Allergy & Precautions, H&P , NPO status , Patient's Chart, lab work & pertinent test results, reviewed documented beta blocker date and time   Airway Mallampati: II  TM Distance: >3 FB Neck ROM: full    Dental  (+) Teeth Intact   Pulmonary asthma    Pulmonary exam normal        Cardiovascular Exercise Tolerance: Good hypertension, On Medications Normal cardiovascular exam+ Valvular Problems/Murmurs  Rhythm:regular Rate:Normal     Neuro/Psych negative neurological ROS  negative psych ROS   GI/Hepatic Neg liver ROS,GERD  Medicated,,  Endo/Other  diabetes, Well ControlledHypothyroidism    Renal/GU negative Renal ROS  negative genitourinary   Musculoskeletal   Abdominal   Peds  Hematology  (+) Blood dyscrasia, anemia   Anesthesia Other Findings Past Medical History: No date: Anemia     Comment:  H/O YRS AGO No date: Asthma     Comment:  well controlled No date: Diabetes mellitus without complication (HCC)     Comment:  weight loss s/p gastric sleeve. just monitored 10/2017: DVT (deep venous thrombosis) (HCC)     Comment:  left leg...to get an ivc filter prior to arthroscopy No date: GERD (gastroesophageal reflux disease) No date: Heart murmur     Comment:  asymptomatic 03/2022: History of recent blood transfusion No date: Hypertension No date: Hypothyroidism Past Surgical History: No date: CARPAL TUNNEL RELEASE; Bilateral No date: CESAREAN SECTION     Comment:  times 4 2012: CHOLECYSTECTOMY     Comment:  at same time as gastric sleeve 01/10/2018: IVC FILTER REMOVAL; N/A     Comment:  Procedure: IVC FILTER REMOVAL;  Surgeon: Annice Needy,               MD;  Location: [facility];  Service:               Cardiovascular;  Laterality: N/A; 11/12/2017: KNEE ARTHROSCOPY WITH MENISCAL REPAIR; Left      Comment:  Procedure: KNEE ARTHROSCOPY WITH MENISCAL REPAIR- ROOT               REPAIR AND CHONDROPLASTY;  Surgeon: Signa Kell, MD;                Location: [facility];  Service: Orthopedics;  Laterality:               Left; 2012: LAPAROSCOPIC GASTRIC SLEEVE RESECTION 12/16/2020: SHOULDER ARTHROSCOPY WITH BICEPS TENDON REPAIR; Right     Comment:  Procedure: Extensive arthroscopic debridement,               arthroscopic subacromial decompression, and mini-open               biceps tenodesis, right shoulder. ;  Surgeon: Christena Flake, MD;  Location: [facility];  Service: Orthopedics;                Laterality: Right; No date: TONSILLECTOMY AND ADENOIDECTOMY 11/12/2017: VENA CAVA FILTER PLACEMENT; Right     Comment:  Procedure: INSERTION VENA-CAVA FILTER;  Surgeon: Annice Needy, MD;  Location: [facility];  Service: Vascular;                Laterality: Right;   Reproductive/Obstetrics negative OB ROS  Anesthesia Physical Anesthesia Plan  ASA: 3  Anesthesia Plan: General ETT   Post-op Pain Management: Regional block*   Induction:   PONV Risk Score and Plan: 4 or greater  Airway Management Planned:   Additional Equipment:   Intra-op Plan:   Post-operative Plan:   Informed Consent: I have reviewed the patients History and Physical, chart, labs and discussed the procedure including the risks, benefits and alternatives for the proposed anesthesia with the patient or authorized representative who has indicated his/her understanding and acceptance.     Dental Advisory Given  Plan Discussed with: CRNA  Anesthesia Plan Comments:        Anesthesia Quick Evaluation

## 2022-09-07 NOTE — Discharge Instructions (Addendum)
Orthopedic discharge instructions: May shower with intact OpSite dressings once nerve block has worn off, approximately on post-op day #4 (Monday).  Apply ice frequently to shoulder. Take tramadol as prescribed when needed.  May supplement with ES Tylenol if necessary. May resume baby aspirin tomorrow. Keep shoulder immobilizer on at all times except may remove for bathing purposes. Follow-up in 10-14 days or as scheduled.     AMBULATORY SURGERY  DISCHARGE INSTRUCTIONS   The drugs that you were given will stay in your system until tomorrow so for the next 24 hours you should not:  Drive an automobile Make any legal decisions Drink any alcoholic beverage   You may resume regular meals tomorrow.  Today it is better to start with liquids and gradually work up to solid foods.  You may eat anything you prefer, but it is better to start with liquids, then soup and crackers, and gradually work up to solid foods.   Please notify your doctor immediately if you have any unusual bleeding, trouble breathing, redness and pain at the surgery site, drainage, fever, or pain not relieved by medication.     Your post-operative visit with Dr.                                       is: Date:                        Time:    Please call to schedule your post-operative visit.  Additional Instructions:

## 2022-09-07 NOTE — H&P (Signed)
History of Present Illness:  [name] is a 46 y.o. female who presents for evaluation and treatment of her recurrent right shoulder pain. The patient is now over 1.5 years status post a right shoulder arthroscopy with debridement, decompression, and mini open biceps tenodesis. The patient was doing quite well at her last follow-up with me 3 months after her surgery, and was permitted to resume all of her normal daily activities without difficulty. The patient states she was doing well until about 3 to 4 months ago when she jerked her lawnmower out of a rut and felt a pain in her shoulder. Since then, she has noted increased discomfort in the anterolateral aspect of the shoulder. Because of continued symptoms, she saw Horris Latino, PA-C, who sent her for an MRI and scan and referred her to me for further evaluation and treatment. The patient notes that her symptoms are aggravated by any activities which require her to hold her arm away from her body such as when driving a vehicle. She also notes intermittent discomfort with lifting activities and at night. She rates her pain at 2/10 on today's visit. She is been taking only Tylenol as necessary with temporary partial relief of her symptoms. She denies any neck pain, nor does she note any numbness or paresthesias down her arm to her hand. She has been performing full duties at work without too much difficulty, although she does have to be careful with certain activities.  Current Outpatient Medications: acetaminophen (TYLENOL) 500 MG tablet Take 1 tablet (500 mg total) by mouth every 6 (six) minutes as needed  aspirin 81 MG EC tablet Take 1 tablet (81 mg total) by mouth once daily  cholecalciferol (VITAMIN D3) 2,000 unit capsule Take 1 capsule (2,000 Units total) by mouth once daily  levothyroxine (SYNTHROID, LEVOTHROID) 88 MCG tablet Take 1 tablet (88 mcg total) by mouth once daily Take on an empty stomach with a glass of water at least 30-60  minutes before breakfast. 90 tablet 3  liothyronine (CYTOMEL) 5 MCG tablet Take 2 tablets (10 mcg total) by mouth 2 (two) times daily Take on an empty stomach with a glass of water at least 30-60 minutes before breakfast.  losartan-hydroCHLOROthiazide (HYZAAR) 100-12.5 mg tablet Take 1 tablet by mouth once daily 90 tablet 1  metaxalone (SKELAXIN) 800 mg tablet Take 1 tablet (800 mg total) by mouth 2 (two) times daily as needed for Pain 30 tablet 0  omeprazole (PRILOSEC) 20 MG DR capsule Take 1 capsule (20 mg total) by mouth once daily  pravastatin (PRAVACHOL) 20 MG tablet Take 1 tablet (20 mg total) by mouth at bedtime 90 tablet 1  SYMBICORT 80-4.5 mcg/actuation inhaler Inhale 2 inhalations into the lungs 2 (two) times daily 33 g 1  tirzepatide (MOUNJARO) 15 mg/0.5 mL PnIj Inject 15 mg subcutaneously every 7 (seven) days 6 mL 3  traZODone (DESYREL) 50 MG tablet Take 1-2 tablets (50-100 mg total) by mouth at bedtime as needed for Sleep 180 tablet 1  VENTOLIN HFA 90 mcg/actuation inhaler Inhale 2 inhalations into the lungs every 4 (four) hours as needed 18 g 5   Allergies:  Shellfish Containing Products (Swelling, Tongue swells - No problems with betadine on skin)  NSAIDs (CAUSES ELEVATED BLOOD PRESSURES at high doses)  Tolmetin (CAUSES ELEVATED BLOOD PRESSURES) Tramadol Itching  Statins-Hmg-Coa Reductase Inhibitors Muscle Pain   Past Medical History:  Acquired hypothyroidism 02/08/2015  Anemia 02/08/2015  Mild post bariatric surgery  Asthma without status asthmaticus  DM II (  diabetes mellitus, type II), controlled (CMS-HCC) 02/08/2015  Essential hypertension 02/08/2015  History of abnormal cervical Pap smear 03/2018 (LSIL HPV+)  Hx of obesity 02/08/2015 (Improved post bariatric surgery)  Multinodular goiter  Pure hypercholesterolemia 02/08/2015  Type 2 diabetes mellitus (CMS-HCC)   Past Surgical History:  COLPOSCOPY 04/09/2018 (abnormal pap 6/19 LSIL HPV+)  Extensive arthroscopic debridement,  arthroscopic subacromial decompression, and mini-open biceps tenodesis, right shoulder Right 12/16/2020 (Dr. Joice Lofts)  ARTHROSCOPY KNEE W/MENISCUS REPAIR Right  CESAREAN SECTION x4  TUBAL LIGATION   Family History:  Diabetes Mother  Hyperlipidemia (Elevated cholesterol) Mother  Depression Mother  Diabetes Father  High blood pressure (Hypertension) Father  Mitral valve prolapse Father (with replacement)  Ovarian cancer Sister 67  Breast cancer Neg Hx  Cervical cancer Neg Hx  Colon cancer Neg Hx   Social History:   Socioeconomic History:  Marital status: Married  Occupational History  Occupation: Charity fundraiser  Tobacco Use  Smoking status: Never  Smokeless tobacco: Never  Vaping Use  Vaping Use: Never used  Substance and Sexual Activity  Alcohol use: No  Drug use: Never  Sexual activity: Not Currently  Partners: Male  Birth control/protection: Abstinence, Surgical   Review of Systems:  A comprehensive 14 point ROS was performed, reviewed, and the pertinent orthopaedic findings are documented in the HPI.  Physical Exam: Vitals:  07/31/22 1255  BP: 118/78  Weight: 89.6 kg (197 lb 9.6 oz)  Height: 157.5 cm (5\' 2" )  PainSc: 2  PainLoc: Shoulder   General/Constitutional: The patient appears to be well-nourished, well-developed, and in no acute distress. Neuro/Psych: Normal mood and affect, oriented to person, place and time. Eyes: Non-icteric. Pupils are equal, round, and reactive to light, and exhibit synchronous movement. ENT: Unremarkable. Lymphatic: No palpable adenopathy. Respiratory: Lungs clear to auscultation, Normal chest excursion, No wheezes, and Non-labored breathing Cardiovascular: Regular rate and rhythm. No murmurs. and No edema, swelling or tenderness, except as noted in detailed exam. Integumentary: No impressive skin lesions present, except as noted in detailed exam. Musculoskeletal: Unremarkable, except as noted in detailed exam.  Right shoulder exam: On  examination, her surgical incision and arthroscopic portal sites are well-healed and without evidence for infection. No swelling, erythema, ecchymosis, abrasions, or other skin abnormalities are identified. There is mild tenderness to palpation over the anterolateral aspect of the shoulder. Actively, she is able to forward flex to 170 degrees, abduct to 165 degrees, and internally rotate to L1. Passively, she is able to tolerate forward flexion to 175 degrees and abduction to 170 degrees. At 90 degrees of abduction, she is able to tolerate external rotation to 90 degrees and internal rotation to 65 degrees. She demonstrates 4+/5 strength with resisted internal and external rotation, as well as with resisted forward flexion and abduction. She denies any pain with range of motion or with resisted strength testing. However, she does note some discomfort when she herself holds her arm in a forward flexed position at 90 degrees for any length of time. She is neurovascularly intact to the right upper extremity and hand.  X-rays/MRI/Lab data:  A recent MRI scan of the right shoulder is available for review and has been reviewed by myself. By report, the study demonstrates evidence of moderate tendinosis of the supraspinatus and infraspinatus tendons with an interstitial partial-thickness tear involving the supraspinatus tendon which appears to be somewhat worse as compared to her previous MRI scan. There also is evidence of some inflammation involving the long head of the biceps tendon. Both the films and  report were reviewed by myself and discussed with the patient.  Assessment: 1. Nontraumatic incomplete tear of right rotator cuff.   2. Rotator cuff tendinitis, right  3. Tendinitis of upper biceps tendon of right shoulder   Plan: The treatment options were discussed with the patient. In addition, patient educational materials were provided regarding the diagnosis and treatment options. The patient is quite  frustrated by her symptoms and function limitations, and is ready to consider more aggressive treatment options. Therefore, I have recommended a surgical procedure, specifically a right shoulder arthroscopy with debridement, decompression, and repair of her partial-thickness rotator cuff tear. The procedure was discussed with the patient, as were the potential risks (including bleeding, infection, nerve and/or blood vessel injury, persistent or recurrent pain, failure of the repair, progression of arthritis, need for further surgery, blood clots, strokes, heart attacks and/or arhythmias, pneumonia, etc.) and benefits. The patient states her understanding and wishes to proceed. All of the patient's questions and concerns were answered. She can call any time with further concerns. The patient also has been fitted with a Slingshot shoulder immobilizer which she has been advised to bring with her on the day of surgery as she was placed into this after her surgery. She will follow up post-surgery, routine.    H&P reviewed and patient re-examined. No changes.

## 2022-09-08 NOTE — Anesthesia Postprocedure Evaluation (Signed)
Anesthesia Post Note  Patient: DEISSY GUILBERT  Procedure(s) Performed: RIGHT SHOULDER ARTHROSCOPY WITH DEBRIDEMENT, DECOMPRESSION, AND REPAIR OF HER PARTIAL-THICKNESS ROTATOR CUFF TEAR (Right: Shoulder)  Patient location during evaluation: PACU Anesthesia Type: Regional Level of consciousness: awake and alert Pain management: pain level controlled Vital Signs Assessment: post-procedure vital signs reviewed and stable Respiratory status: spontaneous breathing, nonlabored ventilation, respiratory function stable and patient connected to nasal cannula oxygen Cardiovascular status: blood pressure returned to baseline and stable Postop Assessment: no apparent nausea or vomiting Anesthetic complications: no   No notable events documented.   Last Vitals:  Vitals:   09/07/22 1645 09/07/22 1700  BP: (!) 105/55 (!) 108/56  Pulse: 84 87  Resp: 15 18  Temp: (!) 36.2 C   SpO2: 96% 100%    Last Pain:  Vitals:   09/07/22 1645  TempSrc:   PainSc: 0-No pain                 Yevette Edwards

## 2022-09-28 ENCOUNTER — Encounter: Payer: Self-pay | Admitting: Surgery

## 2023-11-27 ENCOUNTER — Other Ambulatory Visit: Payer: Self-pay | Admitting: Obstetrics and Gynecology

## 2023-11-27 DIAGNOSIS — Z1231 Encounter for screening mammogram for malignant neoplasm of breast: Secondary | ICD-10-CM

## 2023-12-13 IMAGING — MG MM DIGITAL SCREENING BILAT W/ TOMO AND CAD
8 series · 8 of 24 positions shown · non-contrast
Comparison: Previous exam(s).

CLINICAL DATA: Screening.

EXAM:
DIGITAL SCREENING BILATERAL MAMMOGRAM WITH TOMOSYNTHESIS AND CAD
TECHNIQUE: Bilateral screening digital craniocaudal and mediolateral oblique
mammograms were obtained. Bilateral screening digital breast
tomosynthesis was performed. The images were evaluated with
computer-aided detection.

[R CC synth-2D]
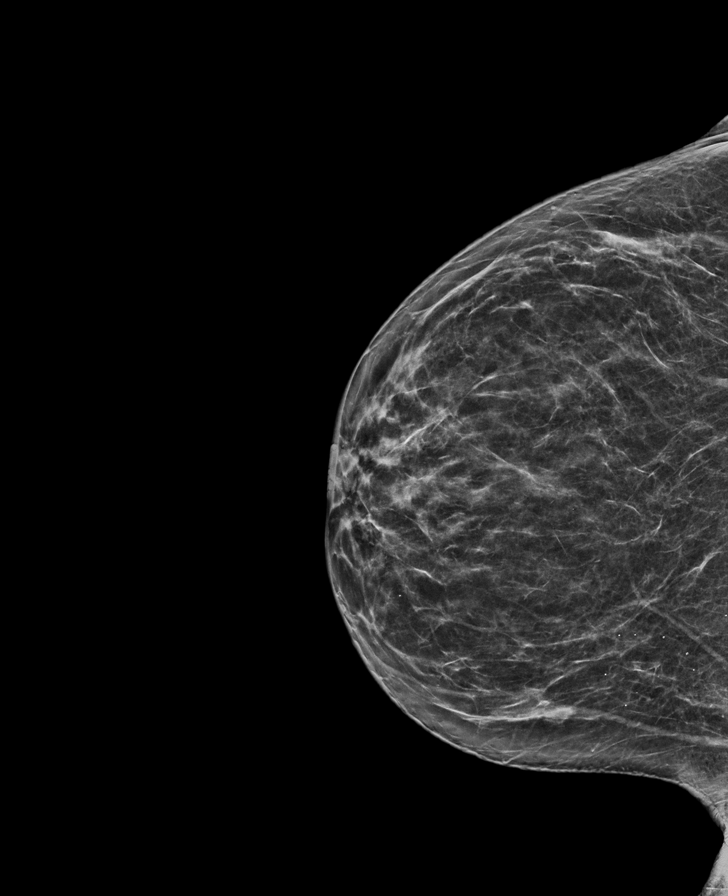

[L CC synth-2D]
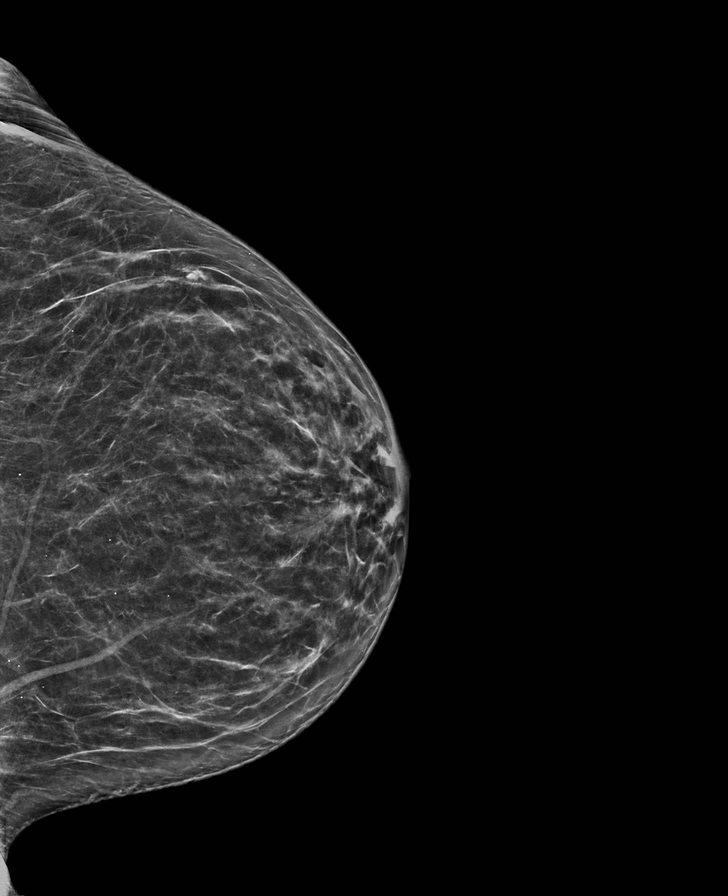

[L MLO synth-2D]
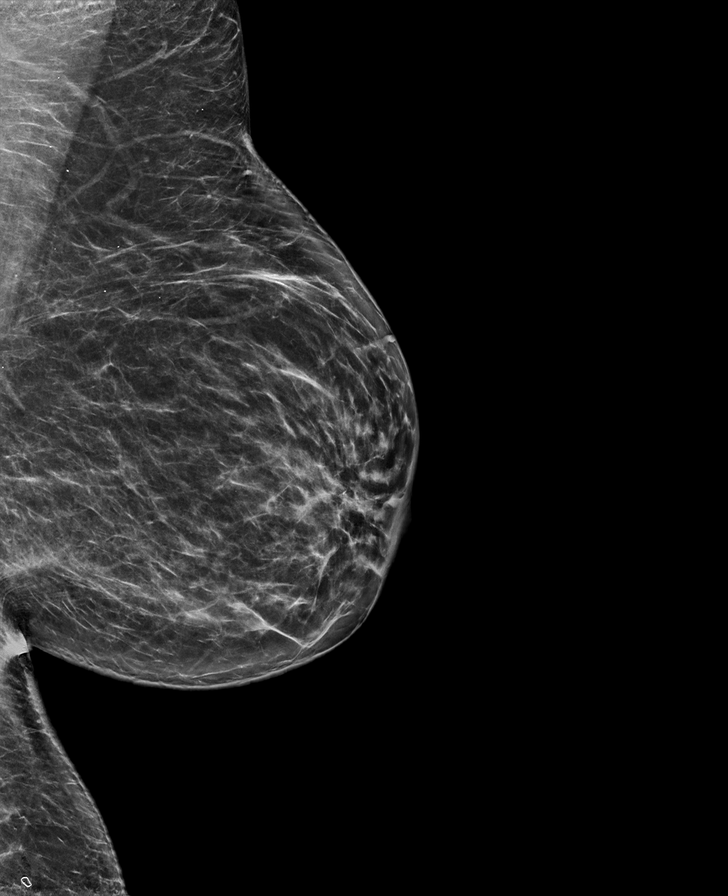

[R MLO synth-2D]
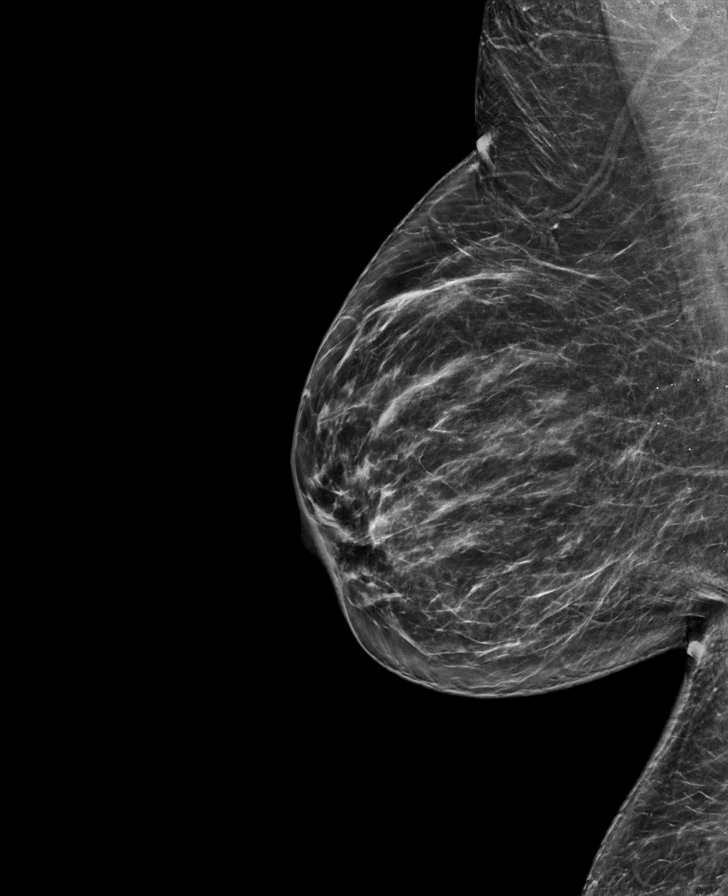

[L MLO tomo · tomo slice 39/77.0]
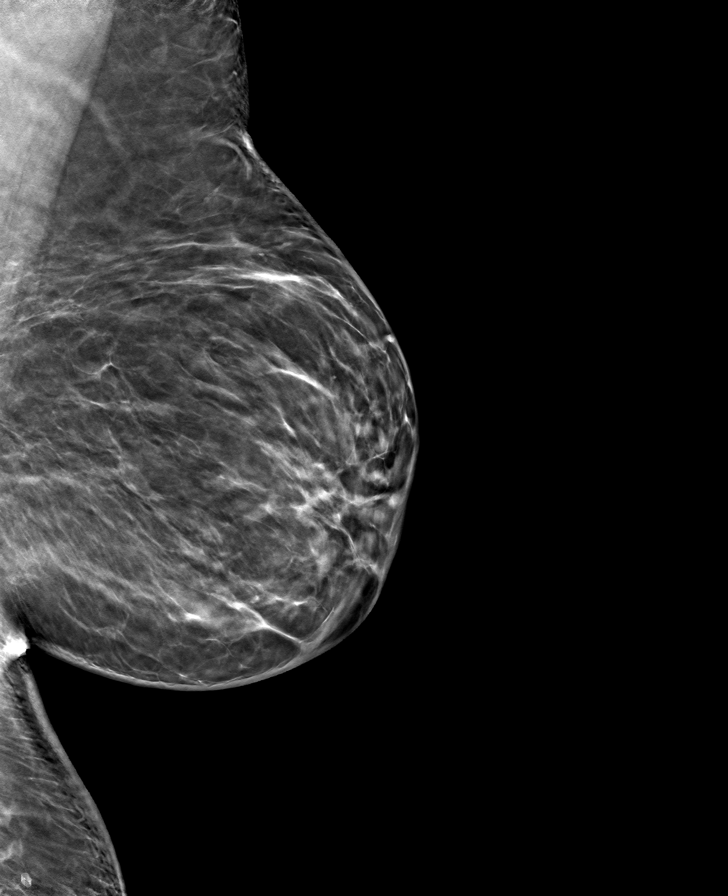

[L CC tomo · tomo slice 35/68.0]
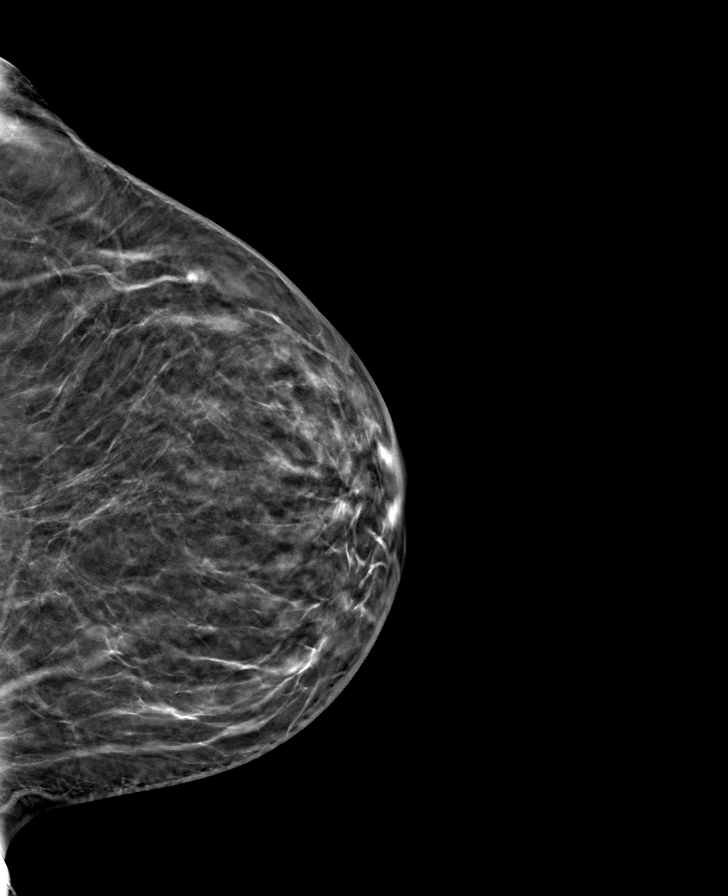

[R MLO tomo · tomo slice 39/76.0]
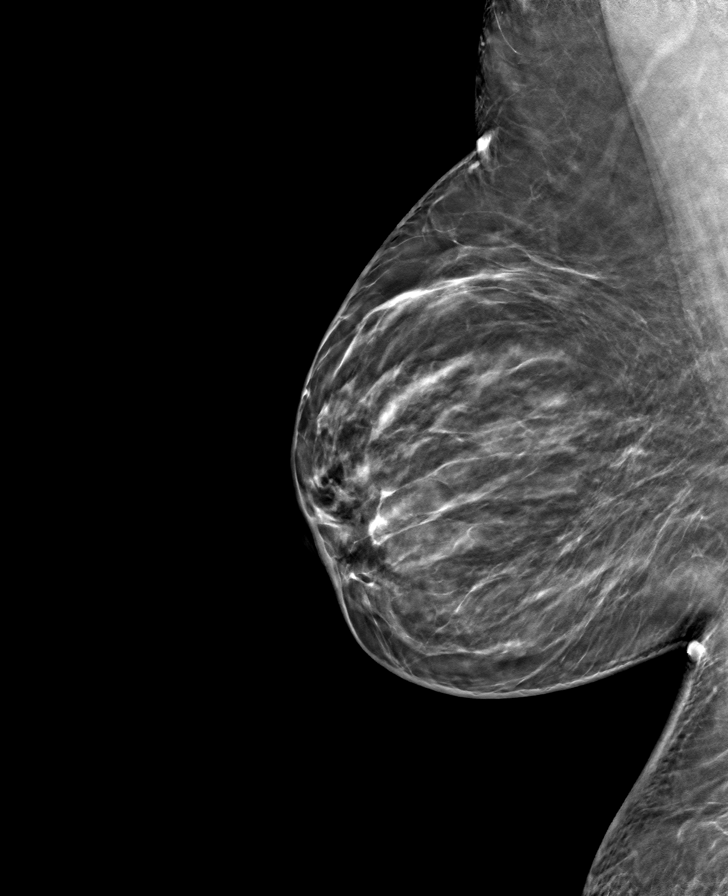

[R CC tomo · tomo slice 33/65.0]
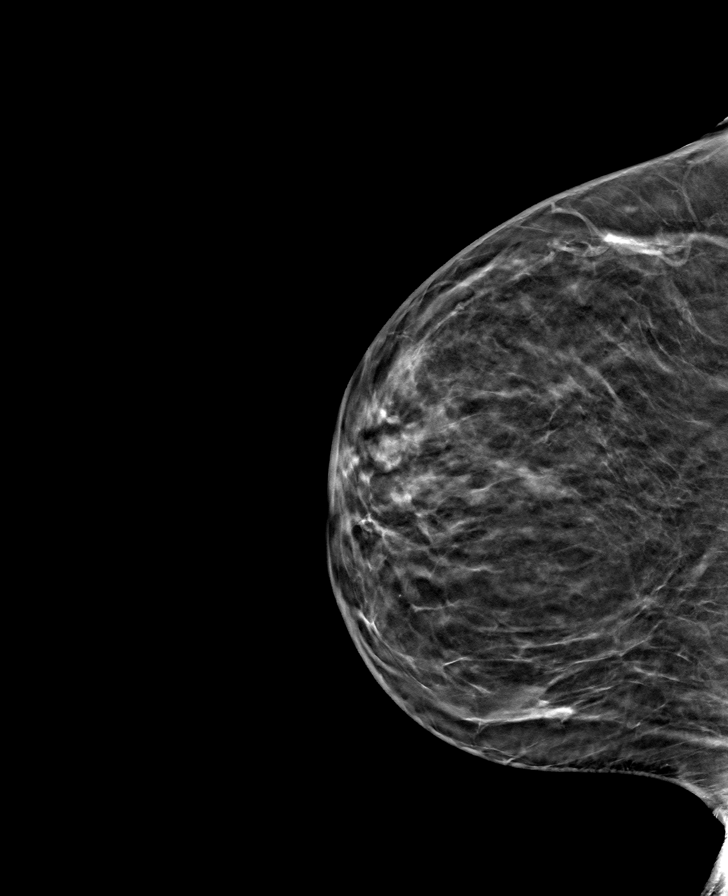

[8 of 24 positions shown; findings below may reference images not displayed]

ACR Breast Density Category b: There are scattered areas of
fibroglandular density.
FINDINGS: There are no findings suspicious for malignancy.
IMPRESSION: No mammographic evidence of malignancy. A result letter of this
screening mammogram will be mailed directly to the patient.

RECOMMENDATION:
Screening mammogram in one year. (Code:51-O-LD2)

BI-RADS CATEGORY  1: Negative.

## 2024-02-14 ENCOUNTER — Ambulatory Visit
Admission: RE | Admit: 2024-02-14 | Discharge: 2024-02-14 | Disposition: A | Discharge status: Home / Self Care | Source: Ambulatory Visit | Attending: Obstetrics and Gynecology | Admitting: Obstetrics and Gynecology

## 2024-02-14 DIAGNOSIS — Z1231 Encounter for screening mammogram for malignant neoplasm of breast: Secondary | ICD-10-CM | POA: Diagnosis present
